# Patient Record
Sex: Female | Born: 1985
Health system: Southern US, Community
[De-identification: ages and names within clinical notes are randomized; demographics above are authoritative.]

## PROBLEM LIST (undated history)

## (undated) DIAGNOSIS — N979 Female infertility, unspecified: Secondary | ICD-10-CM

## (undated) DIAGNOSIS — R569 Unspecified convulsions: Secondary | ICD-10-CM

## (undated) DIAGNOSIS — R001 Bradycardia, unspecified: Secondary | ICD-10-CM

## (undated) HISTORY — DX: Female infertility, unspecified: N97.9

## (undated) HISTORY — PX: TONSILLECTOMY: SUR1361

## (undated) HISTORY — DX: Bradycardia, unspecified: R00.1

## (undated) HISTORY — PX: OTHER SURGICAL HISTORY: SHX169

## (undated) HISTORY — PX: HERNIA REPAIR: SHX51

---

## 2005-04-14 DIAGNOSIS — F419 Anxiety disorder, unspecified: Secondary | ICD-10-CM

## 2005-04-14 HISTORY — DX: Anxiety disorder, unspecified: F41.9

## 2007-07-29 ENCOUNTER — Encounter: Payer: Self-pay | Admitting: Internal Medicine

## 2008-04-25 ENCOUNTER — Ambulatory Visit: Payer: Self-pay | Admitting: Internal Medicine

## 2008-04-25 DIAGNOSIS — R059 Cough, unspecified: Secondary | ICD-10-CM | POA: Insufficient documentation

## 2008-04-25 DIAGNOSIS — R05 Cough: Secondary | ICD-10-CM

## 2008-04-25 DIAGNOSIS — J309 Allergic rhinitis, unspecified: Secondary | ICD-10-CM | POA: Insufficient documentation

## 2008-04-27 ENCOUNTER — Telehealth: Payer: Self-pay | Admitting: Internal Medicine

## 2013-03-14 ENCOUNTER — Other Ambulatory Visit: Payer: Self-pay | Admitting: Obstetrics and Gynecology

## 2013-03-14 DIAGNOSIS — N644 Mastodynia: Secondary | ICD-10-CM

## 2013-03-17 ENCOUNTER — Ambulatory Visit
Admission: RE | Admit: 2013-03-17 | Discharge: 2013-03-17 | Disposition: A | Discharge status: Home / Self Care | Payer: BC Managed Care – PPO | Source: Ambulatory Visit | Attending: Obstetrics and Gynecology | Admitting: Obstetrics and Gynecology

## 2013-03-17 DIAGNOSIS — N644 Mastodynia: Secondary | ICD-10-CM

## 2016-03-10 ENCOUNTER — Emergency Department (HOSPITAL_COMMUNITY)
Admission: EM | Admit: 2016-03-10 | Discharge: 2016-03-10 | Disposition: A | Discharge status: Home / Self Care | Payer: BLUE CROSS/BLUE SHIELD | Attending: Emergency Medicine | Admitting: Emergency Medicine

## 2016-03-10 ENCOUNTER — Encounter (HOSPITAL_COMMUNITY): Payer: Self-pay | Admitting: *Deleted

## 2016-03-10 DIAGNOSIS — R1032 Left lower quadrant pain: Secondary | ICD-10-CM | POA: Diagnosis not present

## 2016-03-10 DIAGNOSIS — R55 Syncope and collapse: Secondary | ICD-10-CM | POA: Insufficient documentation

## 2016-03-10 HISTORY — DX: Unspecified convulsions: R56.9

## 2016-03-10 LAB — I-STAT BETA HCG BLOOD, ED (MC, WL, AP ONLY)

## 2016-03-10 LAB — COMPREHENSIVE METABOLIC PANEL
ALBUMIN: 4.1 g/dL (ref 3.5–5.0)
ALK PHOS: 37 U/L — AB (ref 38–126)
ALT: 23 U/L (ref 14–54)
AST: 33 U/L (ref 15–41)
Anion gap: 8 (ref 5–15)
BILIRUBIN TOTAL: 0.6 mg/dL (ref 0.3–1.2)
BUN: 13 mg/dL (ref 6–20)
CALCIUM: 9.4 mg/dL (ref 8.9–10.3)
CO2: 25 mmol/L (ref 22–32)
CREATININE: 1.04 mg/dL — AB (ref 0.44–1.00)
Chloride: 107 mmol/L (ref 101–111)
GFR calc Af Amer: >60 mL/min (ref >60)
GFR calc non Af Amer: >60 mL/min (ref >60)
GLUCOSE: 110 mg/dL — AB (ref 65–99)
Potassium: 4.3 mmol/L (ref 3.5–5.1)
SODIUM: 140 mmol/L (ref 135–145)
TOTAL PROTEIN: 6.1 g/dL — AB (ref 6.5–8.1)

## 2016-03-10 LAB — URINALYSIS, ROUTINE W REFLEX MICROSCOPIC
BILIRUBIN URINE: NEGATIVE
Glucose, UA: NEGATIVE mg/dL
HGB URINE DIPSTICK: NEGATIVE
KETONES UR: NEGATIVE mg/dL
Leukocytes, UA: NEGATIVE
Nitrite: NEGATIVE
Protein, ur: NEGATIVE mg/dL
SPECIFIC GRAVITY, URINE: 1.014 (ref 1.005–1.030)
pH: 6 (ref 5.0–8.0)

## 2016-03-10 LAB — CBC
HCT: 39 % (ref 36.0–46.0)
HEMOGLOBIN: 12.7 g/dL (ref 12.0–15.0)
MCH: 29.8 pg (ref 26.0–34.0)
MCHC: 32.6 g/dL (ref 30.0–36.0)
MCV: 91.5 fL (ref 78.0–100.0)
PLATELETS: 236 10*3/uL (ref 150–400)
RBC: 4.26 MIL/uL (ref 3.87–5.11)
RDW: 13 % (ref 11.5–15.5)
WBC: 8.5 10*3/uL (ref 4.0–10.5)

## 2016-03-10 LAB — LIPASE, BLOOD: Lipase: 33 U/L (ref 11–51)

## 2016-03-10 MED ORDER — SODIUM CHLORIDE 0.9 % IV BOLUS (SEPSIS)
1000.0000 mL | Freq: Once | INTRAVENOUS | Status: AC
  Administered 2016-03-10: 1000 mL via INTRAVENOUS

## 2016-03-10 MED ORDER — KETOROLAC TROMETHAMINE 30 MG/ML IJ SOLN
30.0000 mg | Freq: Once | INTRAMUSCULAR | Status: AC
  Administered 2016-03-10: 30 mg via INTRAVENOUS
  Filled 2016-03-10: qty 1

## 2016-03-10 MED ORDER — METOCLOPRAMIDE HCL 5 MG/ML IJ SOLN
10.0000 mg | Freq: Once | INTRAMUSCULAR | Status: AC
  Administered 2016-03-10: 10 mg via INTRAVENOUS
  Filled 2016-03-10: qty 2

## 2016-03-10 MED ORDER — METOCLOPRAMIDE HCL 10 MG PO TABS: 10.0000 mg | ORAL_TABLET | Freq: Four times a day (QID) | ORAL | 0 refills | Status: DC | PRN

## 2016-03-10 NOTE — ED Triage Notes (Addendum)
PT states acute onset lower abdominal pain "like something popped", L greater than R, diarrhea and nausea.  2 episodes of "blacking out" for less than 1 min, while sitting on toilet.  Hx of tonic/clonic seizures but hasn't had 1 for 10 years.  Hx of syncope with pain.

## 2016-03-10 NOTE — ED Provider Notes (Signed)
[facility]-EMERGENCY DEPT Provider Note   CSN: [id number] Arrival date & time: 03/10/16  0746     History   Chief Complaint Chief Complaint  Patient presents with  . Abdominal Cramping  . Near Syncope    possible seizure    HPI Rachael Hoover is a 30 y.o. female.   Abdominal Pain   This is a new problem. The current episode started 1 to 2 hours ago. The problem occurs constantly. The problem has been gradually improving. The pain is located in the LLQ. The quality of the pain is pressure-like, sharp and cramping. The pain is moderate. Associated symptoms include vomiting. Pertinent negatives include anorexia, fever, dysuria and frequency. Nothing aggravates the symptoms. Relieved by: time.    Past Medical History:  Diagnosis Date  . Seizures [facility])     Patient Active Problem List   Diagnosis Date Noted  . RHINITIS 04/25/2008  . COUGH 04/25/2008    Past Surgical History:  Procedure Laterality Date  . HERNIA REPAIR     when a small child - inguinal ?  . TONSILLECTOMY      OB History    No data available       Home Medications    Prior to Admission medications   Medication Sig Start Date End Date Taking? Authorizing Provider  ibuprofen (ADVIL,MOTRIN) 200 MG tablet Take 600 mg by mouth every 6 (six) hours as needed for cramping.   Yes Historical Provider, MD  oxymetazoline (AFRIN) 0.05 % nasal spray Place 1 spray into both nostrils at bedtime as needed for congestion.   Yes Historical Provider, MD  metoCLOPramide (REGLAN) 10 MG tablet Take 1 tablet (10 mg total) by mouth every 6 (six) hours as needed for nausea (nausea/headache). 03/10/16   Marily Memos, MD    Family History No family history on file.  Social History Social History  Substance Use Topics  . Smoking status: Never Smoker  . Smokeless tobacco: Never Used  . Alcohol use Yes     Comment: few days a week     Allergies   Hydrocodone-acetaminophen   Review of Systems Review of Systems    Constitutional: Negative for fever.  Gastrointestinal: Positive for abdominal pain and vomiting. Negative for anorexia.  Genitourinary: Negative for dysuria and frequency.  All other systems reviewed and are negative.    Physical Exam Updated Vital Signs BP 114/85 (BP Location: Right Arm)   Pulse 69   Temp 97.6 F (36.4 C) (Oral)   Resp 16   Ht 5\' 8"  (1.727 m)   Wt 165 lb (74.8 kg)   LMP 03/06/2016   SpO2 100%   BMI 25.09 kg/m   Physical Exam  Constitutional: She is oriented to person, place, and time. She appears well-developed and well-nourished.  HENT:  Head: Normocephalic and atraumatic.  Eyes: Conjunctivae and EOM are normal.  Neck: Normal range of motion.  Cardiovascular: Normal rate and regular rhythm.   Pulmonary/Chest: Effort normal. No stridor. No respiratory distress.  Abdominal: Soft. Bowel sounds are normal. She exhibits no distension and no mass. There is no tenderness. There is no rebound.  Musculoskeletal: She exhibits no edema, tenderness or deformity.  Neurological: She is alert and oriented to person, place, and time. No cranial nerve deficit. Coordination normal.  No altered mental status, able to give full seemingly accurate history.  Face is symmetric, EOM's intact, pupils equal and reactive, vision intact, tongue and uvula midline without deviation Upper and Lower extremity motor 5/5, intact pain perception  in distal extremities, 2+ reflexes in biceps, patella and achilles tendons. Finger to nose normal, heel to shin normal. Walks without assistance or evident ataxia.   Skin: Skin is warm and dry.  Nursing note and vitals reviewed.    ED Treatments / Results  Labs (all labs ordered are listed, but only abnormal results are displayed) Labs Reviewed  COMPREHENSIVE METABOLIC PANEL - Abnormal; Notable for the following:       Result Value   Glucose, Bld 110 (*)    Creatinine, Ser 1.04 (*)    Total Protein 6.1 (*)    Alkaline Phosphatase 37 (*)     All other components within normal limits  LIPASE, BLOOD  CBC  URINALYSIS, ROUTINE W REFLEX MICROSCOPIC (NOT [facility])  I-STAT BETA HCG BLOOD, ED ([facility], [facility], [facility] ONLY)    EKG  EKG Interpretation  Date/Time:  Monday March 10 2016 09:50:02 EST Ventricular Rate:  71 PR Interval:    QRS Duration: 87 QT Interval:  402 QTC Calculation: 437 R Axis:   88 Text Interpretation:  Sinus rhythm Probable left atrial enlargement Confirmed by Kellyanne Ellwanger MD, Barbara CowerJASON ([id number]) on 03/10/2016 10:55:04 AM       Radiology No results found.  Procedures Procedures (including critical care time)  Medications Ordered in ED Medications  ketorolac (TORADOL) 30 MG/ML injection 30 mg (30 mg Intravenous Given 03/10/16 0949)  sodium chloride 0.9 % bolus 1,000 mL (1,000 mLs Intravenous New Bag/Given 03/10/16 0949)  metoCLOPramide (REGLAN) injection 10 mg (10 mg Intravenous Given 03/10/16 0949)     Initial Impression / Assessment and Plan / ED Course  I have reviewed the triage vital signs and the nursing notes.  Pertinent labs & imaging results that were available during my care of the patient were reviewed by me and considered in my medical decision making (see chart for details).  Clinical Course    Suspect ovarian cyst that has ruptured. Less likely kidney stone. Also with syncope and myoclonic jerking rather than seizures.   Urine clear, more likely ruptured ovarian cyst. Symptoms improved. Plan for dc on nausea meds with pcp follow up.   Final Clinical Impressions(s) / ED Diagnoses   Final diagnoses:  Left lower quadrant pain  Syncope, unspecified syncope type    New Prescriptions New Prescriptions   METOCLOPRAMIDE (REGLAN) 10 MG TABLET    Take 1 tablet (10 mg total) by mouth every 6 (six) hours as needed for nausea (nausea/headache).     Marily MemosJason Sayana Salley, MD 03/10/16 [phone]

## 2016-03-10 NOTE — ED Notes (Signed)
Pt is in stable condition upon d/c and ambulates from ED. 

## 2016-03-10 NOTE — ED Notes (Signed)
ED Provider at bedside. 

## 2018-05-13 ENCOUNTER — Ambulatory Visit (INDEPENDENT_AMBULATORY_CARE_PROVIDER_SITE_OTHER): Payer: BLUE CROSS/BLUE SHIELD | Admitting: Psychology

## 2018-05-13 DIAGNOSIS — F411 Generalized anxiety disorder: Secondary | ICD-10-CM | POA: Diagnosis not present

## 2018-05-25 ENCOUNTER — Ambulatory Visit (INDEPENDENT_AMBULATORY_CARE_PROVIDER_SITE_OTHER): Payer: BLUE CROSS/BLUE SHIELD | Admitting: Psychology

## 2018-05-25 DIAGNOSIS — F411 Generalized anxiety disorder: Secondary | ICD-10-CM

## 2018-06-08 ENCOUNTER — Ambulatory Visit: Payer: BLUE CROSS/BLUE SHIELD | Admitting: Psychology

## 2018-06-22 ENCOUNTER — Ambulatory Visit (INDEPENDENT_AMBULATORY_CARE_PROVIDER_SITE_OTHER): Payer: BLUE CROSS/BLUE SHIELD | Admitting: Psychology

## 2018-06-22 DIAGNOSIS — F411 Generalized anxiety disorder: Secondary | ICD-10-CM | POA: Diagnosis not present

## 2018-07-07 ENCOUNTER — Ambulatory Visit: Payer: BLUE CROSS/BLUE SHIELD | Admitting: Psychology

## 2018-07-28 ENCOUNTER — Ambulatory Visit (INDEPENDENT_AMBULATORY_CARE_PROVIDER_SITE_OTHER): Payer: BLUE CROSS/BLUE SHIELD | Admitting: Psychology

## 2018-07-28 DIAGNOSIS — F411 Generalized anxiety disorder: Secondary | ICD-10-CM

## 2018-08-24 ENCOUNTER — Ambulatory Visit: Payer: BLUE CROSS/BLUE SHIELD | Admitting: Psychology

## 2018-09-07 ENCOUNTER — Ambulatory Visit (INDEPENDENT_AMBULATORY_CARE_PROVIDER_SITE_OTHER): Payer: BLUE CROSS/BLUE SHIELD | Admitting: Psychology

## 2018-09-07 DIAGNOSIS — F411 Generalized anxiety disorder: Secondary | ICD-10-CM | POA: Diagnosis not present

## 2018-09-27 ENCOUNTER — Ambulatory Visit (INDEPENDENT_AMBULATORY_CARE_PROVIDER_SITE_OTHER): Payer: BC Managed Care – PPO | Admitting: Psychology

## 2018-09-27 DIAGNOSIS — F411 Generalized anxiety disorder: Secondary | ICD-10-CM | POA: Diagnosis not present

## 2018-10-19 ENCOUNTER — Ambulatory Visit (INDEPENDENT_AMBULATORY_CARE_PROVIDER_SITE_OTHER): Payer: 59 | Admitting: Psychology

## 2018-10-19 DIAGNOSIS — R69 Illness, unspecified: Secondary | ICD-10-CM | POA: Diagnosis not present

## 2018-10-19 DIAGNOSIS — F411 Generalized anxiety disorder: Secondary | ICD-10-CM | POA: Diagnosis not present

## 2018-11-01 ENCOUNTER — Ambulatory Visit (INDEPENDENT_AMBULATORY_CARE_PROVIDER_SITE_OTHER): Payer: 59 | Admitting: Psychology

## 2018-11-01 DIAGNOSIS — R69 Illness, unspecified: Secondary | ICD-10-CM | POA: Diagnosis not present

## 2018-11-01 DIAGNOSIS — F411 Generalized anxiety disorder: Secondary | ICD-10-CM | POA: Diagnosis not present

## 2018-11-15 ENCOUNTER — Ambulatory Visit (INDEPENDENT_AMBULATORY_CARE_PROVIDER_SITE_OTHER): Payer: 59 | Admitting: Psychology

## 2018-11-15 DIAGNOSIS — R69 Illness, unspecified: Secondary | ICD-10-CM | POA: Diagnosis not present

## 2018-11-15 DIAGNOSIS — F411 Generalized anxiety disorder: Secondary | ICD-10-CM

## 2018-11-29 ENCOUNTER — Ambulatory Visit (INDEPENDENT_AMBULATORY_CARE_PROVIDER_SITE_OTHER): Payer: 59 | Admitting: Psychology

## 2018-11-29 DIAGNOSIS — R69 Illness, unspecified: Secondary | ICD-10-CM | POA: Diagnosis not present

## 2018-11-29 DIAGNOSIS — F411 Generalized anxiety disorder: Secondary | ICD-10-CM | POA: Diagnosis not present

## 2018-12-07 DIAGNOSIS — Z130 Encounter for screening for diseases of the blood and blood-forming organs and certain disorders involving the immune mechanism: Secondary | ICD-10-CM | POA: Diagnosis not present

## 2018-12-07 DIAGNOSIS — Z01419 Encounter for gynecological examination (general) (routine) without abnormal findings: Secondary | ICD-10-CM | POA: Diagnosis not present

## 2018-12-07 DIAGNOSIS — N970 Female infertility associated with anovulation: Secondary | ICD-10-CM | POA: Insufficient documentation

## 2018-12-07 DIAGNOSIS — N946 Dysmenorrhea, unspecified: Secondary | ICD-10-CM | POA: Insufficient documentation

## 2018-12-07 DIAGNOSIS — Z1389 Encounter for screening for other disorder: Secondary | ICD-10-CM | POA: Diagnosis not present

## 2018-12-07 DIAGNOSIS — N926 Irregular menstruation, unspecified: Secondary | ICD-10-CM | POA: Diagnosis not present

## 2018-12-07 DIAGNOSIS — Z6828 Body mass index (BMI) 28.0-28.9, adult: Secondary | ICD-10-CM | POA: Diagnosis not present

## 2018-12-22 ENCOUNTER — Ambulatory Visit: Payer: 59 | Admitting: Psychology

## 2019-01-12 DIAGNOSIS — N926 Irregular menstruation, unspecified: Secondary | ICD-10-CM | POA: Diagnosis not present

## 2019-01-14 ENCOUNTER — Ambulatory Visit: Payer: Self-pay

## 2019-01-14 DIAGNOSIS — Z23 Encounter for immunization: Secondary | ICD-10-CM

## 2019-01-21 DIAGNOSIS — N979 Female infertility, unspecified: Secondary | ICD-10-CM | POA: Diagnosis not present

## 2019-01-24 ENCOUNTER — Ambulatory Visit (INDEPENDENT_AMBULATORY_CARE_PROVIDER_SITE_OTHER): Payer: 59 | Admitting: Psychology

## 2019-01-24 DIAGNOSIS — R69 Illness, unspecified: Secondary | ICD-10-CM | POA: Diagnosis not present

## 2019-01-24 DIAGNOSIS — F411 Generalized anxiety disorder: Secondary | ICD-10-CM | POA: Diagnosis not present

## 2019-02-11 ENCOUNTER — Ambulatory Visit (INDEPENDENT_AMBULATORY_CARE_PROVIDER_SITE_OTHER): Payer: 59 | Admitting: Psychology

## 2019-02-11 DIAGNOSIS — R69 Illness, unspecified: Secondary | ICD-10-CM | POA: Diagnosis not present

## 2019-02-11 DIAGNOSIS — F411 Generalized anxiety disorder: Secondary | ICD-10-CM

## 2019-02-22 DIAGNOSIS — M25512 Pain in left shoulder: Secondary | ICD-10-CM | POA: Diagnosis not present

## 2019-02-28 ENCOUNTER — Ambulatory Visit: Payer: 59 | Admitting: Psychology

## 2019-03-08 DIAGNOSIS — N9489 Other specified conditions associated with female genital organs and menstrual cycle: Secondary | ICD-10-CM | POA: Diagnosis not present

## 2019-03-08 DIAGNOSIS — N926 Irregular menstruation, unspecified: Secondary | ICD-10-CM | POA: Diagnosis not present

## 2019-03-22 ENCOUNTER — Ambulatory Visit: Payer: 59 | Admitting: Psychology

## 2019-03-22 ENCOUNTER — Ambulatory Visit (INDEPENDENT_AMBULATORY_CARE_PROVIDER_SITE_OTHER): Payer: 59 | Admitting: Psychology

## 2019-03-22 DIAGNOSIS — R69 Illness, unspecified: Secondary | ICD-10-CM | POA: Diagnosis not present

## 2019-03-22 DIAGNOSIS — F411 Generalized anxiety disorder: Secondary | ICD-10-CM

## 2019-04-01 DIAGNOSIS — N979 Female infertility, unspecified: Secondary | ICD-10-CM | POA: Diagnosis not present

## 2019-04-05 ENCOUNTER — Ambulatory Visit: Payer: Managed Care, Other (non HMO) | Attending: Internal Medicine

## 2019-04-05 DIAGNOSIS — Z20828 Contact with and (suspected) exposure to other viral communicable diseases: Secondary | ICD-10-CM | POA: Diagnosis not present

## 2019-04-05 DIAGNOSIS — Z20822 Contact with and (suspected) exposure to covid-19: Secondary | ICD-10-CM

## 2019-04-07 LAB — NOVEL CORONAVIRUS, NAA: SARS-CoV-2, NAA: DETECTED — AB

## 2019-04-11 ENCOUNTER — Ambulatory Visit (INDEPENDENT_AMBULATORY_CARE_PROVIDER_SITE_OTHER): Payer: Managed Care, Other (non HMO) | Admitting: Psychology

## 2019-04-11 DIAGNOSIS — F411 Generalized anxiety disorder: Secondary | ICD-10-CM | POA: Diagnosis not present

## 2019-04-11 DIAGNOSIS — R69 Illness, unspecified: Secondary | ICD-10-CM | POA: Diagnosis not present

## 2019-05-13 ENCOUNTER — Ambulatory Visit (INDEPENDENT_AMBULATORY_CARE_PROVIDER_SITE_OTHER): Payer: Managed Care, Other (non HMO) | Admitting: Psychology

## 2019-05-13 DIAGNOSIS — F411 Generalized anxiety disorder: Secondary | ICD-10-CM | POA: Diagnosis not present

## 2019-05-13 DIAGNOSIS — R69 Illness, unspecified: Secondary | ICD-10-CM | POA: Diagnosis not present

## 2019-06-10 DIAGNOSIS — R869 Unspecified abnormal finding in specimens from male genital organs: Secondary | ICD-10-CM | POA: Insufficient documentation

## 2019-06-10 DIAGNOSIS — N9489 Other specified conditions associated with female genital organs and menstrual cycle: Secondary | ICD-10-CM | POA: Diagnosis not present

## 2019-06-17 ENCOUNTER — Ambulatory Visit (INDEPENDENT_AMBULATORY_CARE_PROVIDER_SITE_OTHER): Payer: 59 | Admitting: Psychology

## 2019-06-17 DIAGNOSIS — F411 Generalized anxiety disorder: Secondary | ICD-10-CM

## 2019-06-17 DIAGNOSIS — R69 Illness, unspecified: Secondary | ICD-10-CM | POA: Diagnosis not present

## 2019-07-08 ENCOUNTER — Ambulatory Visit (INDEPENDENT_AMBULATORY_CARE_PROVIDER_SITE_OTHER): Payer: 59 | Admitting: Psychology

## 2019-07-08 DIAGNOSIS — R69 Illness, unspecified: Secondary | ICD-10-CM | POA: Diagnosis not present

## 2019-07-08 DIAGNOSIS — F411 Generalized anxiety disorder: Secondary | ICD-10-CM | POA: Diagnosis not present

## 2019-08-04 ENCOUNTER — Ambulatory Visit: Payer: 59 | Admitting: Primary Care

## 2019-08-15 ENCOUNTER — Ambulatory Visit (INDEPENDENT_AMBULATORY_CARE_PROVIDER_SITE_OTHER): Payer: 59 | Admitting: Psychology

## 2019-08-15 DIAGNOSIS — F411 Generalized anxiety disorder: Secondary | ICD-10-CM | POA: Diagnosis not present

## 2019-08-15 DIAGNOSIS — R69 Illness, unspecified: Secondary | ICD-10-CM | POA: Diagnosis not present

## 2019-08-19 ENCOUNTER — Ambulatory Visit (INDEPENDENT_AMBULATORY_CARE_PROVIDER_SITE_OTHER): Payer: 59 | Admitting: Family Medicine

## 2019-08-19 ENCOUNTER — Encounter: Payer: Self-pay | Admitting: Family Medicine

## 2019-08-19 ENCOUNTER — Other Ambulatory Visit: Payer: Self-pay

## 2019-08-19 VITALS — BP 118/76 | HR 100 | Temp 97.5°F | Ht 68.0 in | Wt 184.0 lb

## 2019-08-19 DIAGNOSIS — F419 Anxiety disorder, unspecified: Secondary | ICD-10-CM | POA: Diagnosis not present

## 2019-08-19 DIAGNOSIS — Z130 Encounter for screening for diseases of the blood and blood-forming organs and certain disorders involving the immune mechanism: Secondary | ICD-10-CM

## 2019-08-19 DIAGNOSIS — N644 Mastodynia: Secondary | ICD-10-CM | POA: Insufficient documentation

## 2019-08-19 DIAGNOSIS — E559 Vitamin D deficiency, unspecified: Secondary | ICD-10-CM | POA: Diagnosis not present

## 2019-08-19 DIAGNOSIS — E663 Overweight: Secondary | ICD-10-CM

## 2019-08-19 DIAGNOSIS — J310 Chronic rhinitis: Secondary | ICD-10-CM

## 2019-08-19 DIAGNOSIS — T485X5A Adverse effect of other anti-common-cold drugs, initial encounter: Secondary | ICD-10-CM | POA: Diagnosis not present

## 2019-08-19 DIAGNOSIS — R69 Illness, unspecified: Secondary | ICD-10-CM | POA: Diagnosis not present

## 2019-08-19 DIAGNOSIS — F410 Panic disorder [episodic paroxysmal anxiety] without agoraphobia: Secondary | ICD-10-CM

## 2019-08-19 LAB — TSH: TSH: 1.07 u[IU]/mL (ref 0.35–4.50)

## 2019-08-19 LAB — CBC WITH DIFFERENTIAL/PLATELET
Basophils Absolute: 0 10*3/uL (ref 0.0–0.1)
Basophils Relative: 0.4 % (ref 0.0–3.0)
Eosinophils Absolute: 0.1 10*3/uL (ref 0.0–0.7)
Eosinophils Relative: 1.5 % (ref 0.0–5.0)
HCT: 42.4 % (ref 36.0–46.0)
Hemoglobin: 14.3 g/dL (ref 12.0–15.0)
Lymphocytes Relative: 26.1 % (ref 12.0–46.0)
Lymphs Abs: 1.4 10*3/uL (ref 0.7–4.0)
MCHC: 33.8 g/dL (ref 30.0–36.0)
MCV: 89 fl (ref 78.0–100.0)
Monocytes Absolute: 0.5 10*3/uL (ref 0.1–1.0)
Monocytes Relative: 8.5 % (ref 3.0–12.0)
Neutro Abs: 3.4 10*3/uL (ref 1.4–7.7)
Neutrophils Relative %: 63.5 % (ref 43.0–77.0)
Platelets: 275 10*3/uL (ref 150.0–400.0)
RBC: 4.76 Mil/uL (ref 3.87–5.11)
RDW: 11.9 % (ref 11.5–15.5)
WBC: 5.3 10*3/uL (ref 4.0–10.5)

## 2019-08-19 LAB — LIPID PANEL
Cholesterol: 205 mg/dL — ABNORMAL HIGH (ref 0–200)
HDL: 81.2 mg/dL (ref >39.00)
LDL Cholesterol: 113 mg/dL — ABNORMAL HIGH (ref 0–99)
NonHDL: 123.97
Total CHOL/HDL Ratio: 3
Triglycerides: 56 mg/dL (ref 0.0–149.0)
VLDL: 11.2 mg/dL (ref 0.0–40.0)

## 2019-08-19 LAB — VITAMIN D 25 HYDROXY (VIT D DEFICIENCY, FRACTURES): VITD: 52.06 ng/mL (ref 30.00–100.00)

## 2019-08-19 MED ORDER — CLONAZEPAM 0.5 MG PO TABS: 0.5000 mg | ORAL_TABLET | Freq: Two times a day (BID) | ORAL | 0 refills | Status: DC | PRN

## 2019-08-19 MED ORDER — FLUTICASONE PROPIONATE 50 MCG/ACT NA SUSP: 2.0000 | Freq: Every day | NASAL | 6 refills | Status: DC

## 2019-08-19 MED ORDER — ALPRAZOLAM ER 0.5 MG PO TB24: 0.5000 mg | ORAL_TABLET | Freq: Every day | ORAL | 0 refills | Status: DC | PRN

## 2019-08-19 NOTE — Patient Instructions (Signed)
A resource that I like is www.dietdoctor.com/diabetes/diet  Here are some guidelines to help you with meal planning -  Avoid all processed and packaged foods (bread, pasta, crackers, chips, etc) and beverages containing calories.  Avoid added sugars and excessive natural sugars.  Attention to how you feel if you consume artificial sweeteners.  Do they make you more hungry or raise your blood sugar?  With every meal and snack, aim to get 20 g of protein (3 ounces of meat, 4 ounces of fish, 3 eggs, protein powder, 1 cup Greek yogurt, 1 cup cottage cheese, etc.)  Increase fiber in the form of non-starchy vegetables.  These help you feel full with very little carbohydrates and are good for gut health.  Eat 1 serving healthy carb per meal- 1/2 cup brown rice, beans, potato, corn- pay attention to whether or not this significantly raises your blood sugar. If it does, reduce the frequency you consume these.   Eat 2-3 servings of lower sugar fruits daily.  This includes berries, apples, oranges, peaches, pears, one half banana.  Have small amounts of good fats such as avocado, nuts, olive oil, nut butters, olives.  Add a little cheese to your salads to make them tasty.    

## 2019-08-19 NOTE — Progress Notes (Signed)
Subjective:    Patient ID: Rachael Hoover, female    DOB: 05-25-1985, 34 y.o.   MRN: [id number]  HPI This is a 34 yo female who presents today to establish care, prior patient Dr. Schuyler Amor. Works for the [employer] but will be going to work for [employer] in [employer]. Married to [place]. Enjoys riding horses. Has been trying to conceive for 4 years.   Last CPE- 11/2018 Pap- 06/03/2016, seeing gyn Dr. Ellyn Hack Tdap- unsure Covid- second vaccine 07/14/19 Flu- annual Eye- 2016, overdue Dental- regular Exercise- not regular Sleep- good, always tired Diet- terrible, junk  Anxiety disorder- for several years. Rarely uses clonopin/ alprazolam. Still has some in bottles, seeing Synetta Fail for therapy has been very helpful.  Left shoulder pain- chronic, has seen ortho, given exercises.  Intermittent in nature.  Not currently bothering her.  Low vit d- diagnosed 8/20, has been on high dose supplement  Allergic rhinitis-has been using Afrin style nasal spray several times a day for a while now.  Has trouble with congestion especially at nighttime if she is not using.  Has not tried anything else.  Review of Systems Per HPI    Objective:   Physical Exam Vitals reviewed.  Constitutional:      General: She is not in acute distress.    Appearance: Normal appearance. She is normal weight. She is not ill-appearing, toxic-appearing or diaphoretic.  HENT:     Head: Normocephalic and atraumatic.     Right Ear: External ear normal.     Left Ear: External ear normal.     Nose: Nose normal.     Mouth/Throat:     Mouth: Mucous membranes are moist.     Pharynx: Oropharynx is clear.  Eyes:     Conjunctiva/sclera: Conjunctivae normal.  Cardiovascular:     Rate and Rhythm: Normal rate and regular rhythm.     Heart sounds: Normal heart sounds.  Pulmonary:     Effort: Pulmonary effort is normal.     Breath sounds: Normal breath sounds.  Musculoskeletal:     Cervical back: Normal range of  motion and neck supple.  Skin:    General: Skin is warm and dry.  Neurological:     Mental Status: She is alert and oriented to person, place, and time.  Psychiatric:        Mood and Affect: Mood normal.        Behavior: Behavior normal.        Thought Content: Thought content normal.        Judgment: Judgment normal.       BP 118/76   Pulse 100   Temp (!) 97.5 F (36.4 C) (Tympanic)   Ht 5\' 8"  (1.727 m)   Wt 184 lb (83.5 kg)   SpO2 97%   BMI 27.98 kg/m  Wt Readings from Last 3 Encounters:  08/19/19 184 lb (83.5 kg)  03/10/16 165 lb (74.8 kg)       Depression screen PHQ 2/9 08/19/2019  Decreased Interest 0  Down, Depressed, Hopeless 0  PHQ - 2 Score 0    Assessment & Plan:  1. Nasal congestion due to prolonged use of decongestants -She will start with fluticasone nasal spray twice a day for 3 days and then go to once a day.  During this time she will decrease amount of Afrin that she is using until she is not using any. - fluticasone (FLONASE) 50 MCG/ACT nasal spray; Place 2 sprays into both nostrils  daily.  Dispense: 16 g; Refill: 6  2. Panic attack -Improved with therapy and rare use of benzodiazepines.  Encouraged her to continue therapy especially during job transition and provided small amount of benzodiazepines for as needed use. - ALPRAZolam (XANAX XR) 0.5 MG 24 hr tablet; Take 1 tablet (0.5 mg total) by mouth daily as needed for anxiety (panic attack).  Dispense: 10 tablet; Refill: 0  3. Anxiety - clonazePAM (KLONOPIN) 0.5 MG tablet; Take 1 tablet (0.5 mg total) by mouth 2 (two) times daily as needed for anxiety.  Dispense: 20 tablet; Refill: 0  4. Vitamin D deficiency - Vitamin D, 25-hydroxy  5. Overweight (BMI 25.0-29.9) -Provided information about healthy food choices and meal planning and encouraged patient to work on 1 meal per week - TSH - Lipid Panel  6. Screening for deficiency anemia - CBC with Differential  -Follow-up in follow-up for  CPE  This visit occurred during the SARS-CoV-2 public health emergency.  Safety protocols were in place, including screening questions prior to the visit, additional usage of staff PPE, and extensive cleaning of exam room while observing appropriate contact time as indicated for disinfecting solutions.    Clarene Reamer, FNP-BC  [facility]  08/19/2019 5:33 PM

## 2019-09-01 ENCOUNTER — Ambulatory Visit (INDEPENDENT_AMBULATORY_CARE_PROVIDER_SITE_OTHER): Payer: 59 | Admitting: Psychology

## 2019-09-01 DIAGNOSIS — F411 Generalized anxiety disorder: Secondary | ICD-10-CM | POA: Diagnosis not present

## 2019-09-01 DIAGNOSIS — R69 Illness, unspecified: Secondary | ICD-10-CM | POA: Diagnosis not present

## 2019-09-19 ENCOUNTER — Ambulatory Visit (INDEPENDENT_AMBULATORY_CARE_PROVIDER_SITE_OTHER): Payer: 59 | Admitting: Psychology

## 2019-09-19 DIAGNOSIS — R69 Illness, unspecified: Secondary | ICD-10-CM | POA: Diagnosis not present

## 2019-09-19 DIAGNOSIS — F411 Generalized anxiety disorder: Secondary | ICD-10-CM

## 2020-01-20 ENCOUNTER — Ambulatory Visit (INDEPENDENT_AMBULATORY_CARE_PROVIDER_SITE_OTHER): Payer: BC Managed Care – PPO | Admitting: Psychology

## 2020-01-20 DIAGNOSIS — F411 Generalized anxiety disorder: Secondary | ICD-10-CM | POA: Diagnosis not present

## 2020-01-27 LAB — RESULTS CONSOLE HPV: CHL HPV: NEGATIVE

## 2020-01-27 LAB — HM PAP SMEAR: HM Pap smear: NORMAL

## 2020-02-13 ENCOUNTER — Ambulatory Visit (INDEPENDENT_AMBULATORY_CARE_PROVIDER_SITE_OTHER): Payer: BC Managed Care – PPO | Admitting: Psychology

## 2020-02-13 DIAGNOSIS — F411 Generalized anxiety disorder: Secondary | ICD-10-CM | POA: Diagnosis not present

## 2020-05-04 ENCOUNTER — Ambulatory Visit (INDEPENDENT_AMBULATORY_CARE_PROVIDER_SITE_OTHER): Payer: BC Managed Care – PPO | Admitting: Psychology

## 2020-05-04 DIAGNOSIS — F411 Generalized anxiety disorder: Secondary | ICD-10-CM | POA: Diagnosis not present

## 2020-06-01 ENCOUNTER — Ambulatory Visit: Payer: BC Managed Care – PPO | Admitting: Psychology

## 2020-07-06 ENCOUNTER — Ambulatory Visit (INDEPENDENT_AMBULATORY_CARE_PROVIDER_SITE_OTHER): Payer: BC Managed Care – PPO | Admitting: Psychology

## 2020-07-06 DIAGNOSIS — F411 Generalized anxiety disorder: Secondary | ICD-10-CM

## 2021-01-12 ENCOUNTER — Other Ambulatory Visit: Payer: Self-pay

## 2021-01-12 ENCOUNTER — Encounter: Payer: Self-pay | Admitting: Emergency Medicine

## 2021-01-12 ENCOUNTER — Ambulatory Visit
Admission: EM | Admit: 2021-01-12 | Discharge: 2021-01-12 | Disposition: A | Discharge status: Home / Self Care | Payer: BC Managed Care – PPO | Attending: Emergency Medicine | Admitting: Emergency Medicine

## 2021-01-12 DIAGNOSIS — J329 Chronic sinusitis, unspecified: Secondary | ICD-10-CM

## 2021-01-12 DIAGNOSIS — B9689 Other specified bacterial agents as the cause of diseases classified elsewhere: Secondary | ICD-10-CM | POA: Diagnosis not present

## 2021-01-12 MED ORDER — AMOXICILLIN 875 MG PO TABS: 875.0000 mg | ORAL_TABLET | Freq: Two times a day (BID) | ORAL | 0 refills | Status: AC

## 2021-01-12 NOTE — ED Provider Notes (Signed)
CHIEF COMPLAINT:   Chief Complaint  Patient presents with   URI     SUBJECTIVE/HPI:  HPI A very pleasant 35 y.o.Female presents today with sinus pressure, congestion, sore throat and cough that started about 8 days ago.. Patient does not report any shortness of breath, chest pain, palpitations, visual changes, weakness, tingling, headache, nausea, vomiting, diarrhea, fever, chills.  Patient is currently [redacted] weeks pregnant.   has a past medical history of Anxiety (2007) and Seizures (HCC).  ROS:  Review of Systems See Subjective/HPI Medications, Allergies and Problem List personally reviewed in Epic today OBJECTIVE:   Vitals:   01/12/21 1502  BP: (!) 131/95  Pulse: 90  Resp: 18  Temp: 99.6 F (37.6 C)  SpO2: 98%    Physical Exam   General: Appears well-developed and well-nourished. No acute distress.  HEENT Head: Normocephalic and atraumatic.  + frontal and nasal bridge tenderness noted to palpation. Ears: Hearing grossly intact, no drainage or visible deformity.  Nose: No nasal deviation. Mouth/Throat: No stridor or tracheal deviation.  Non erythematous posterior pharynx noted with clear drainage present.  No white patchy exudate noted. Eyes: Conjunctivae and EOM are normal. No eye drainage or scleral icterus bilaterally.  Neck: Normal range of motion, neck is supple. Cardiovascular: Normal rate. Regular rhythm; no murmurs, gallops, or rubs.  Pulm/Chest: No respiratory distress. Breath sounds normal bilaterally without wheezes, rhonchi, or rales.  Neurological: Alert and oriented to person, place, and time.  Skin: Skin is warm and dry.  No rashes, lesions, abrasions or bruising noted to skin.   Psychiatric: Normal mood, affect, behavior, and thought content.   Vital signs and nursing note reviewed.   Patient stable and cooperative with examination. PROCEDURES:    LABS/X-RAYS/EKG/MEDS:   No results found for any visits on 01/12/21.  MEDICAL DECISION MAKING:    Patient presents with sinus pressure, congestion, sore throat and cough that started about 8 days ago.. Patient does not report any shortness of breath, chest pain, palpitations, visual changes, weakness, tingling, headache, nausea, vomiting, diarrhea, fever, chills. Patient is currently [redacted] weeks pregnant.  Given symptoms along with assessment findings, concern for frontal bacterial sinusitis.  Rx'd amoxicillin to the patient's preferred pharmacy and advised about home treatment and care to include rest, Mucinex, Robitussin, Flonase.  Advised to return for any new or worse swelling or redness in her face, around her eyes or new high fever.  Also advised that she may use Tylenol as needed and the use of a humidifier may also help her symptoms.  Patient verbalized understanding and agreed with treatment plan.  Patient stable upon discharge. ASSESSMENT/PLAN:  1. Bacterial sinusitis - amoxicillin (AMOXIL) 875 MG tablet; Take 1 tablet (875 mg total) by mouth 2 (two) times daily for 7 days.  Dispense: 14 tablet; Refill: 0 Instructions about new medications and side effects provided.  Plan:   Discharge Instructions      Take amoxicillin as prescribed.  You may also use Mucinex or Robitussin as needed for coughing.  Sinusitis is an infection of the lining of the sinus cavities in your head. Sinusitis often follows a cold. It causes pain and pressure in your head and face. Take antibiotics as directed. Do not stop taking them just because you feel better. You need to take the full course of antibiotics. Rest, push lots of fluids (especially water), and utilize supportive care for symptoms. Breathe warm, moist area from a steamy shower, hot bath, or sink filled with hot water.  Avoid cold,  dry air.  Using a humidifier in your home may help.  Follow the directions for cleaning the machine. Put a hot, wet towel or a warm gel pack on your face 3-4 times a day for 5-10 minutes each time. You may take  acetaminophen (Tylenol) every 4-6 hours for muscle pain, joint pain, headaches. Flonase nasal spray can help alleviate congestion and sinus pressure. Saline nasal sprays or rinses can also help nasal congestion (use bottled or sterile water). Warm tea with lemon and honey can sooth sore throat and cough, as can cough drops.   Return to clinic for new or worse swelling or redness in your face or around your eyes, or if you have a new or higher fever.          Amalia Greenhouse, FNP 01/12/21 1521

## 2021-01-12 NOTE — ED Triage Notes (Addendum)
Symptoms for 8 days.  Sinus pressure, congestion, sore throat, cough.  Denies fever  Patient is pregnant and is an IVF patient

## 2021-01-12 NOTE — Discharge Instructions (Signed)
Take amoxicillin as prescribed.  You may also use Mucinex or Robitussin as needed for coughing.  Sinusitis is an infection of the lining of the sinus cavities in your head. Sinusitis often follows a cold. It causes pain and pressure in your head and face. Take antibiotics as directed. Do not stop taking them just because you feel better. You need to take the full course of antibiotics. Rest, push lots of fluids (especially water), and utilize supportive care for symptoms. Breathe warm, moist area from a steamy shower, hot bath, or sink filled with hot water.  Avoid cold, dry air.  Using a humidifier in your home may help.  Follow the directions for cleaning the machine. Put a hot, wet towel or a warm gel pack on your face 3-4 times a day for 5-10 minutes each time. You may take acetaminophen (Tylenol) every 4-6 hours for muscle pain, joint pain, headaches. Flonase nasal spray can help alleviate congestion and sinus pressure. Saline nasal sprays or rinses can also help nasal congestion (use bottled or sterile water). Warm tea with lemon and honey can sooth sore throat and cough, as can cough drops.   Return to clinic for new or worse swelling or redness in your face or around your eyes, or if you have a new or higher fever.

## 2021-02-11 LAB — HEPATITIS C ANTIBODY: HCV Ab: NEGATIVE

## 2021-02-11 LAB — OB RESULTS CONSOLE ABO/RH

## 2021-02-11 LAB — OB RESULTS CONSOLE RPR: RPR: NONREACTIVE

## 2021-02-11 LAB — OB RESULTS CONSOLE ANTIBODY SCREEN: Antibody Screen: NEGATIVE

## 2021-02-11 LAB — OB RESULTS CONSOLE GC/CHLAMYDIA: Gonorrhea: NEGATIVE

## 2021-02-11 LAB — OB RESULTS CONSOLE RUBELLA ANTIBODY, IGM: Rubella: IMMUNE

## 2021-04-14 DIAGNOSIS — I10 Essential (primary) hypertension: Secondary | ICD-10-CM

## 2021-04-14 HISTORY — DX: Essential (primary) hypertension: I10

## 2021-04-14 NOTE — L&D Delivery Note (Signed)
Delivery Note At 5:58 PM a viable and healthy female was delivered via Vaginal, Spontaneous (Presentation: Right Occiput Anterior).  APGAR: 8, 9; weight pending .   Placenta status: Spontaneous, Intact.  Cord: 3 vessels with loose nuchal cord x 1  The patient pushed for approximately 2 and half hours and delivered a vigorous female infant in the vertex ROA presentation with Apgar scores of 8 at 1 minute and 9 at 5 minutes.  Following delivery the infant was passed to the maternal abdomen.  Following a 1 minute delay, the cord was clamped and cut.  Placenta delivered spontaneously, intact, with three-vessel cord.  A small first-degree vaginal laceration was repaired with 3-0 Vicryl Rapide.  All sponge, instrument, needle counts were correct. Mother and baby are doing well following delivery.  .   Anesthesia: Epidural Episiotomy: None Lacerations: Vaginal;1st degree Suture Repair: 3.0 vicryl rapide Est. Blood Loss (mL): 237  Mom to postpartum.  Baby to Couplet care / Skin to Skin.  Waynard Reeds 08/30/2021, 6:29 PM

## 2021-04-15 DIAGNOSIS — O09519 Supervision of elderly primigravida, unspecified trimester: Secondary | ICD-10-CM | POA: Diagnosis not present

## 2021-04-15 DIAGNOSIS — R69 Illness, unspecified: Secondary | ICD-10-CM | POA: Diagnosis not present

## 2021-04-15 DIAGNOSIS — O4402 Placenta previa specified as without hemorrhage, second trimester: Secondary | ICD-10-CM | POA: Diagnosis not present

## 2021-04-15 DIAGNOSIS — O09812 Supervision of pregnancy resulting from assisted reproductive technology, second trimester: Secondary | ICD-10-CM | POA: Diagnosis not present

## 2021-04-15 DIAGNOSIS — O09512 Supervision of elderly primigravida, second trimester: Secondary | ICD-10-CM | POA: Diagnosis not present

## 2021-04-15 DIAGNOSIS — Z363 Encounter for antenatal screening for malformations: Secondary | ICD-10-CM | POA: Diagnosis not present

## 2021-04-15 DIAGNOSIS — Z3A19 19 weeks gestation of pregnancy: Secondary | ICD-10-CM | POA: Diagnosis not present

## 2021-04-24 DIAGNOSIS — J0120 Acute ethmoidal sinusitis, unspecified: Secondary | ICD-10-CM | POA: Diagnosis not present

## 2021-04-24 DIAGNOSIS — J310 Chronic rhinitis: Secondary | ICD-10-CM | POA: Diagnosis not present

## 2021-06-18 DIAGNOSIS — Z23 Encounter for immunization: Secondary | ICD-10-CM | POA: Diagnosis not present

## 2021-06-18 DIAGNOSIS — Z3689 Encounter for other specified antenatal screening: Secondary | ICD-10-CM | POA: Diagnosis not present

## 2021-06-18 DIAGNOSIS — O09513 Supervision of elderly primigravida, third trimester: Secondary | ICD-10-CM | POA: Diagnosis not present

## 2021-06-18 DIAGNOSIS — O09813 Supervision of pregnancy resulting from assisted reproductive technology, third trimester: Secondary | ICD-10-CM | POA: Diagnosis not present

## 2021-06-18 DIAGNOSIS — Z6791 Unspecified blood type, Rh negative: Secondary | ICD-10-CM | POA: Diagnosis not present

## 2021-06-18 DIAGNOSIS — Z3A28 28 weeks gestation of pregnancy: Secondary | ICD-10-CM | POA: Diagnosis not present

## 2021-07-18 DIAGNOSIS — O4443 Low lying placenta NOS or without hemorrhage, third trimester: Secondary | ICD-10-CM | POA: Diagnosis not present

## 2021-07-18 DIAGNOSIS — O09513 Supervision of elderly primigravida, third trimester: Secondary | ICD-10-CM | POA: Diagnosis not present

## 2021-07-18 DIAGNOSIS — O4403 Placenta previa specified as without hemorrhage, third trimester: Secondary | ICD-10-CM | POA: Diagnosis not present

## 2021-07-18 DIAGNOSIS — O09813 Supervision of pregnancy resulting from assisted reproductive technology, third trimester: Secondary | ICD-10-CM | POA: Diagnosis not present

## 2021-07-18 DIAGNOSIS — R69 Illness, unspecified: Secondary | ICD-10-CM | POA: Diagnosis not present

## 2021-07-18 DIAGNOSIS — Z3A32 32 weeks gestation of pregnancy: Secondary | ICD-10-CM | POA: Diagnosis not present

## 2021-08-13 DIAGNOSIS — O4443 Low lying placenta NOS or without hemorrhage, third trimester: Secondary | ICD-10-CM | POA: Diagnosis not present

## 2021-08-13 DIAGNOSIS — Z3A36 36 weeks gestation of pregnancy: Secondary | ICD-10-CM | POA: Diagnosis not present

## 2021-08-13 DIAGNOSIS — O09519 Supervision of elderly primigravida, unspecified trimester: Secondary | ICD-10-CM | POA: Diagnosis not present

## 2021-08-13 DIAGNOSIS — Z3685 Encounter for antenatal screening for Streptococcus B: Secondary | ICD-10-CM | POA: Diagnosis not present

## 2021-08-13 LAB — OB RESULTS CONSOLE GBS: GBS: NEGATIVE

## 2021-08-14 ENCOUNTER — Encounter: Payer: Self-pay | Admitting: *Deleted

## 2021-08-15 ENCOUNTER — Other Ambulatory Visit: Payer: Self-pay | Admitting: Obstetrics and Gynecology

## 2021-08-15 DIAGNOSIS — Z363 Encounter for antenatal screening for malformations: Secondary | ICD-10-CM

## 2021-08-19 ENCOUNTER — Encounter: Payer: Self-pay | Admitting: *Deleted

## 2021-08-19 ENCOUNTER — Other Ambulatory Visit: Payer: Self-pay | Admitting: Obstetrics and Gynecology

## 2021-08-19 ENCOUNTER — Ambulatory Visit: Payer: 59 | Admitting: *Deleted

## 2021-08-19 ENCOUNTER — Ambulatory Visit: Payer: 59 | Attending: Obstetrics and Gynecology

## 2021-08-19 VITALS — BP 133/91 | HR 87

## 2021-08-19 DIAGNOSIS — Z363 Encounter for antenatal screening for malformations: Secondary | ICD-10-CM | POA: Insufficient documentation

## 2021-08-19 DIAGNOSIS — O09513 Supervision of elderly primigravida, third trimester: Secondary | ICD-10-CM | POA: Insufficient documentation

## 2021-08-28 ENCOUNTER — Telehealth (HOSPITAL_COMMUNITY): Payer: Self-pay | Admitting: *Deleted

## 2021-08-28 ENCOUNTER — Other Ambulatory Visit: Payer: Self-pay | Admitting: Obstetrics and Gynecology

## 2021-08-28 ENCOUNTER — Encounter (HOSPITAL_COMMUNITY): Payer: Self-pay | Admitting: *Deleted

## 2021-08-28 DIAGNOSIS — O133 Gestational [pregnancy-induced] hypertension without significant proteinuria, third trimester: Secondary | ICD-10-CM

## 2021-08-28 DIAGNOSIS — Z3A38 38 weeks gestation of pregnancy: Secondary | ICD-10-CM | POA: Diagnosis not present

## 2021-08-28 DIAGNOSIS — O139 Gestational [pregnancy-induced] hypertension without significant proteinuria, unspecified trimester: Secondary | ICD-10-CM | POA: Diagnosis not present

## 2021-08-28 NOTE — H&P (Signed)
Rachael Hoover is a 36 y.o. female G1P0 at 38 5/7 weeks (EDD   09/07/21 by 6 week US and known DOC from IVF) presenting for IOL for gestational hypertension.  Prenatal care significant for:  1) Anxiety disorder  took meds prior to pregnancy (xanax and klonopin)--off now and doing ok   2) Primary female infertility  Stage 4 endometriosis--clinical diagnosis by US with endometriomas Lupron then embryo transfer   3) Placenta previa  Posterior previa on anatomy scan  RESOLVED MFM US 08/19/21 EFW 53%ile 6#13oz, vertex, LLP resolved   4) RhD negative   5) Advanced maternal age gravida  Embryo tested 46XX In vitro fertilization--elective single FET of PGT tested embryo on 12/20/2020  46XX  6) Possible borderline CHTN  baseline BP 130-140-80-90      Baby ASA q day         OB History     Gravida  1   Para      Term      Preterm      AB      Living         SAB      IAB      Ectopic      Multiple      Live Births             Past Medical History:  Diagnosis Date   Anxiety 2007   Infertility, female    Newborn product of in vitro fertilization (IVF) pregnancy    Past Surgical History:  Procedure Laterality Date   HERNIA REPAIR     when a small child - inguinal ?   ivf     TONSILLECTOMY     Family History: family history includes Anxiety disorder in her mother; Hypertension in her father. Social History:  reports that she has never smoked. She has never used smokeless tobacco. She reports that she does not currently use alcohol. She reports that she does not use drugs.     Maternal Diabetes: No Genetic Screening: Normal Maternal Ultrasounds/Referrals: Normal Fetal Ultrasounds or other Referrals:  None Maternal Substance Abuse:  No Significant Maternal Medications:  None Significant Maternal Lab Results:  Group B Strep negative Other Comments:  None  Review of Systems  Constitutional:  Negative for fever.  Gastrointestinal:  Negative for abdominal  pain.  Genitourinary:  Negative for vaginal bleeding.  Neurological:  Negative for headaches.  Maternal Medical History:  Contractions: Frequency: irregular.   Perceived severity is mild.   Fetal activity: Perceived fetal activity is normal.   Prenatal complications: IVF, gestational HTN, AMA Prenatal Complications - Diabetes: none.    Last menstrual period 01/10/2021. Exam Physical Exam  Prenatal labs: ABO, Rh: O/Negative/-- (10/31 0000) Antibody: Negative (10/31 0000) Rubella: Immune (10/31 0000) RPR: Nonreactive (10/31 0000)  HBsAg: Negative (10/31 0000)  HIV: Non-reactive (10/31 0000)  GBS:   Negative One hour GCT 118 Hgb AA Carrier screen negative x 3  Assessment/Plan: Pt for ripening and IOL at term for gestational hypertension. Labs on arrival.  Rx BP as needed.    Jeanae Whitmill W Ihan Pat 08/28/2021, 10:41 PM     

## 2021-08-28 NOTE — Telephone Encounter (Signed)
Preadmission screen  

## 2021-08-28 NOTE — H&P (View-Only) (Signed)
Rachael Hoover is a 36 y.o. female G1P0 at 69 5/7 weeks (EDD   09/07/21 by 6 week [place] and known DOC from IVF) presenting for IOL for gestational hypertension.  Prenatal care significant for:  1) Anxiety disorder  took meds prior to pregnancy (xanax and klonopin)--off now and doing ok   2) Primary female infertility  Stage 4 endometriosis--clinical diagnosis by [place] with endometriomas Lupron then embryo transfer   3) Placenta previa  Posterior previa on anatomy scan  RESOLVED MFM [place] 08/19/21 EFW 53%ile 6#13oz, vertex, LLP resolved   4) RhD negative   5) Advanced maternal age gravida  Embryo tested 46XX In vitro fertilization--elective single FET of PGT tested embryo on 12/20/2020  46XX  6) Possible borderline CHTN  baseline BP 130-140-80-90      Baby ASA q day         OB History     Gravida  1   Para      Term      Preterm      AB      Living         SAB      IAB      Ectopic      Multiple      Live Births             Past Medical History:  Diagnosis Date   Anxiety 2007   Infertility, female    Newborn product of in vitro fertilization (IVF) pregnancy    Past Surgical History:  Procedure Laterality Date   HERNIA REPAIR     when a small child - inguinal ?   ivf     TONSILLECTOMY     Family History: family history includes Anxiety disorder in her mother; Hypertension in her father. Social History:  reports that she has never smoked. She has never used smokeless tobacco. She reports that she does not currently use alcohol. She reports that she does not use drugs.     Maternal Diabetes: No Genetic Screening: Normal Maternal Ultrasounds/Referrals: Normal Fetal Ultrasounds or other Referrals:  None Maternal Substance Abuse:  No Significant Maternal Medications:  None Significant Maternal Lab Results:  Group B Strep negative Other Comments:  None  Review of Systems  Constitutional:  Negative for fever.  Gastrointestinal:  Negative for abdominal  pain.  Genitourinary:  Negative for vaginal bleeding.  Neurological:  Negative for headaches.  Maternal Medical History:  Contractions: Frequency: irregular.   Perceived severity is mild.   Fetal activity: Perceived fetal activity is normal.   Prenatal complications: IVF, gestational HTN, AMA Prenatal Complications - Diabetes: none.    Last menstrual period 01/10/2021. Exam Physical Exam  Prenatal labs: ABO, Rh: O/Negative/-- (10/31 0000) Antibody: Negative (10/31 0000) Rubella: Immune (10/31 0000) RPR: Nonreactive (10/31 0000)  HBsAg: Negative (10/31 0000)  HIV: Non-reactive (10/31 0000)  GBS:   Negative One hour GCT 118 Hgb AA Carrier screen negative x 3  Assessment/Plan: Pt for ripening and IOL at term for gestational hypertension. Labs on arrival.  Rx BP as needed.    Logan Bores 08/28/2021, 10:41 PM

## 2021-08-29 ENCOUNTER — Inpatient Hospital Stay (HOSPITAL_COMMUNITY): Payer: 59

## 2021-08-29 ENCOUNTER — Other Ambulatory Visit: Payer: Self-pay

## 2021-08-29 ENCOUNTER — Encounter (HOSPITAL_COMMUNITY): Payer: Self-pay | Admitting: Obstetrics and Gynecology

## 2021-08-29 ENCOUNTER — Inpatient Hospital Stay (HOSPITAL_COMMUNITY)
Admission: AD | Admit: 2021-08-29 | Discharge: 2021-09-01 | DRG: 807 | Disposition: A | Discharge status: Home / Self Care | Payer: 59 | Attending: Obstetrics and Gynecology | Admitting: Obstetrics and Gynecology

## 2021-08-29 DIAGNOSIS — F419 Anxiety disorder, unspecified: Secondary | ICD-10-CM | POA: Diagnosis not present

## 2021-08-29 DIAGNOSIS — O99892 Other specified diseases and conditions complicating childbirth: Secondary | ICD-10-CM | POA: Diagnosis not present

## 2021-08-29 DIAGNOSIS — Z3A38 38 weeks gestation of pregnancy: Secondary | ICD-10-CM

## 2021-08-29 DIAGNOSIS — O26893 Other specified pregnancy related conditions, third trimester: Secondary | ICD-10-CM | POA: Diagnosis not present

## 2021-08-29 DIAGNOSIS — O133 Gestational [pregnancy-induced] hypertension without significant proteinuria, third trimester: Principal | ICD-10-CM | POA: Diagnosis present

## 2021-08-29 DIAGNOSIS — N809 Endometriosis, unspecified: Secondary | ICD-10-CM | POA: Diagnosis not present

## 2021-08-29 DIAGNOSIS — Z6791 Unspecified blood type, Rh negative: Secondary | ICD-10-CM

## 2021-08-29 DIAGNOSIS — O134 Gestational [pregnancy-induced] hypertension without significant proteinuria, complicating childbirth: Secondary | ICD-10-CM | POA: Diagnosis not present

## 2021-08-29 DIAGNOSIS — R69 Illness, unspecified: Secondary | ICD-10-CM | POA: Diagnosis not present

## 2021-08-29 LAB — COMPREHENSIVE METABOLIC PANEL
ALT: 17 U/L (ref 0–44)
AST: 23 U/L (ref 15–41)
Albumin: 3.1 g/dL — ABNORMAL LOW (ref 3.5–5.0)
Alkaline Phosphatase: 140 U/L — ABNORMAL HIGH (ref 38–126)
Anion gap: 9 (ref 5–15)
BUN: 11 mg/dL (ref 6–20)
CO2: 21 mmol/L — ABNORMAL LOW (ref 22–32)
Calcium: 8.9 mg/dL (ref 8.9–10.3)
Chloride: 109 mmol/L (ref 98–111)
Creatinine, Ser: 0.95 mg/dL (ref 0.44–1.00)
GFR, Estimated: >60 mL/min (ref >60)
Glucose, Bld: 85 mg/dL (ref 70–99)
Potassium: 3.5 mmol/L (ref 3.5–5.1)
Sodium: 139 mmol/L (ref 135–145)
Total Bilirubin: 0.6 mg/dL (ref 0.3–1.2)
Total Protein: 6.3 g/dL — ABNORMAL LOW (ref 6.5–8.1)

## 2021-08-29 LAB — CBC
HCT: 40 % (ref 36.0–46.0)
Hemoglobin: 13 g/dL (ref 12.0–15.0)
MCH: 29.4 pg (ref 26.0–34.0)
MCHC: 32.5 g/dL (ref 30.0–36.0)
MCV: 90.5 fL (ref 80.0–100.0)
Platelets: 282 10*3/uL (ref 150–400)
RBC: 4.42 MIL/uL (ref 3.87–5.11)
RDW: 12.1 % (ref 11.5–15.5)
WBC: 10.1 10*3/uL (ref 4.0–10.5)
nRBC: 0 % (ref 0.0–0.2)

## 2021-08-29 LAB — TYPE AND SCREEN
ABO/RH(D): O NEG
Antibody Screen: POSITIVE

## 2021-08-29 LAB — PROTEIN / CREATININE RATIO, URINE
Creatinine, Urine: 17.99 mg/dL
Total Protein, Urine: <6 mg/dL

## 2021-08-29 LAB — RPR: RPR Ser Ql: NONREACTIVE

## 2021-08-29 MED ORDER — OXYTOCIN BOLUS FROM INFUSION
333.0000 mL | Freq: Once | INTRAVENOUS | Status: AC
Start: 2021-08-29 — End: 2021-08-30
  Administered 2021-08-30: 333 mL via INTRAVENOUS

## 2021-08-29 MED ORDER — SOD CITRATE-CITRIC ACID 500-334 MG/5ML PO SOLN
30.0000 mL | ORAL | Status: DC | PRN
Start: 2021-08-29 — End: 2021-09-01
  Administered 2021-08-30: 30 mL via ORAL
  Filled 2021-08-29: qty 30

## 2021-08-29 MED ORDER — LABETALOL HCL 5 MG/ML IV SOLN: 80.0000 mg | INTRAVENOUS | Status: DC | PRN

## 2021-08-29 MED ORDER — LIDOCAINE HCL (PF) 1 % IJ SOLN
30.0000 mL | INTRAMUSCULAR | Status: DC | PRN
Start: 2021-08-29 — End: 2021-09-01

## 2021-08-29 MED ORDER — TERBUTALINE SULFATE 1 MG/ML IJ SOLN: 0.2500 mg | Freq: Once | INTRAMUSCULAR | Status: DC | PRN

## 2021-08-29 MED ORDER — OXYTOCIN-SODIUM CHLORIDE 30-0.9 UT/500ML-% IV SOLN
1.0000 m[IU]/min | INTRAVENOUS | Status: DC
  Administered 2021-08-29: 2 m[IU]/min via INTRAVENOUS
  Filled 2021-08-29: qty 500

## 2021-08-29 MED ORDER — MISOPROSTOL 25 MCG QUARTER TABLET
25.0000 ug | ORAL_TABLET | ORAL | Status: DC
Start: 2021-08-29 — End: 2021-08-31
  Administered 2021-08-29: 25 ug via VAGINAL
  Filled 2021-08-29 (×4): qty 1

## 2021-08-29 MED ORDER — LABETALOL HCL 5 MG/ML IV SOLN: 20.0000 mg | INTRAVENOUS | Status: DC | PRN

## 2021-08-29 MED ORDER — MISOPROSTOL 50MCG HALF TABLET
50.0000 ug | ORAL_TABLET | ORAL | Status: DC | PRN
  Administered 2021-08-29: 50 ug via BUCCAL
  Filled 2021-08-29: qty 1

## 2021-08-29 MED ORDER — ACETAMINOPHEN 325 MG PO TABS
650.0000 mg | ORAL_TABLET | ORAL | Status: DC | PRN
  Filled 2021-08-29: qty 2

## 2021-08-29 MED ORDER — LACTATED RINGERS IV SOLN
INTRAVENOUS | Status: DC
Start: 2021-08-29 — End: 2021-09-01

## 2021-08-29 MED ORDER — LACTATED RINGERS IV SOLN
500.0000 mL | INTRAVENOUS | Status: DC | PRN
  Administered 2021-08-29: 1000 mL via INTRAVENOUS
  Administered 2021-08-30 (×2): 500 mL via INTRAVENOUS

## 2021-08-29 MED ORDER — ONDANSETRON HCL 4 MG/2ML IJ SOLN
4.0000 mg | Freq: Four times a day (QID) | INTRAMUSCULAR | Status: DC | PRN
  Administered 2021-08-30: 4 mg via INTRAVENOUS
  Filled 2021-08-29: qty 2

## 2021-08-29 MED ORDER — HYDRALAZINE HCL 20 MG/ML IJ SOLN
10.0000 mg | INTRAMUSCULAR | Status: DC | PRN
Start: 2021-08-29 — End: 2021-09-01

## 2021-08-29 MED ORDER — LABETALOL HCL 5 MG/ML IV SOLN: 40.0000 mg | INTRAVENOUS | Status: DC | PRN

## 2021-08-29 MED ORDER — BUTORPHANOL TARTRATE 2 MG/ML IJ SOLN
1.0000 mg | INTRAMUSCULAR | Status: DC | PRN
  Administered 2021-08-29 – 2021-08-30 (×2): 1 mg via INTRAVENOUS
  Filled 2021-08-29 (×2): qty 1

## 2021-08-29 MED ORDER — OXYTOCIN-SODIUM CHLORIDE 30-0.9 UT/500ML-% IV SOLN
2.5000 [IU]/h | INTRAVENOUS | Status: DC
  Filled 2021-08-29: qty 500

## 2021-08-29 NOTE — Interval H&P Note (Signed)
History and Physical Interval Note:  08/29/2021 9:36 AM Pt arrived for IOL and after getting IV placed had a vagal episode with hypotension and concurrent fetal HR deceleration.  Mother and baby quickly recovered with IV fluid bolus and she now feels fine.   FHR category 1.  Cervix 50/cl/-2 Cytotec placed with no issues.    Oliver Pila

## 2021-08-29 NOTE — Progress Notes (Signed)
Patient reported feeling dizzy and lightheaded after PIV insertion attempt. Patient pale and diaphoretic. Patient placed in supine position and IVFB started. Patient had syncopal episode. Safety measures implemented. Blood pressure assessed 121/83, HR 62.   Ten minutes later, patient reports feeling the same way. Blood pressure assessed, 80/46, 62/35, 92/60. MD notified. FHR symptomatic. Patient repositioned, reports feeling better. FHR back to baseline. BP WDL 122/88. MD on way, no new orders.   Nada Boozer [place], RN Rayvon Char, RN

## 2021-08-29 NOTE — Progress Notes (Signed)
Patient ID: AXIE KNOCHE, female   DOB: 03/06/1986, 36 y.o.   MRN: [id number] Pt on 6 mu pitocin and feeling some contractions, mild  Afeb VSS  Cervical exam deferred  Pt seems to be responding to pitocin, will continue and recheck when a bit more uncomfortable.

## 2021-08-29 NOTE — Progress Notes (Signed)
Patient ID: Rachael Hoover, female   DOB: 01/30/1986, 36 y.o.   MRN: [id number] Pt was feeling mild/mod cramping with pitocin  Afeb VSS  FHR category 1  Cervix 60/2/-2/posterior  D/w pt foley bulb but she declines Able to AROM with FSE, scant fluid Will follow progress, latent phase

## 2021-08-29 NOTE — Progress Notes (Signed)
Patient ID: Rachael Hoover, female   DOB: 16-Apr-1985, 36 y.o.   MRN: [id number] Feeling mild cramping s/p buccal cytotec Afeb BP 120/80's  FHR category 1  Cervix 50/1-2/-2, vertex   Will start pitocin and see how responds with contractions.

## 2021-08-29 NOTE — Progress Notes (Signed)
Patient ID: Rachael Hoover, female   DOB: 12/06/1985, 36 y.o.   MRN: [id number] Pt s/p cytotec #1 and feeling mild cramping  FHR category 1 Afeb  Occasional BP to 130/90  Cervix cl/50/-2  D/w pt attempting foley bulb and she prefers another dose of cytotec at this time.  Will do 28mcg buccal.  Reassess in 4 hours

## 2021-08-30 ENCOUNTER — Inpatient Hospital Stay (HOSPITAL_COMMUNITY): Payer: 59 | Admitting: Anesthesiology

## 2021-08-30 ENCOUNTER — Encounter (HOSPITAL_COMMUNITY): Payer: Self-pay | Admitting: Obstetrics and Gynecology

## 2021-08-30 MED ORDER — DIBUCAINE (PERIANAL) 1 % EX OINT: 1.0000 "application " | TOPICAL_OINTMENT | CUTANEOUS | Status: DC | PRN

## 2021-08-30 MED ORDER — FENTANYL-BUPIVACAINE-NACL 0.5-0.125-0.9 MG/250ML-% EP SOLN: 12.0000 mL/h | EPIDURAL | Status: DC | PRN

## 2021-08-30 MED ORDER — ZOLPIDEM TARTRATE 5 MG PO TABS: 5.0000 mg | ORAL_TABLET | Freq: Every evening | ORAL | Status: DC | PRN

## 2021-08-30 MED ORDER — EPHEDRINE 5 MG/ML INJ: 10.0000 mg | INTRAVENOUS | Status: DC | PRN

## 2021-08-30 MED ORDER — LACTATED RINGERS AMNIOINFUSION
INTRAVENOUS | Status: DC
Start: 2021-08-30 — End: 2021-09-01
  Filled 2021-08-30 (×8): qty 1000

## 2021-08-30 MED ORDER — BENZOCAINE-MENTHOL 20-0.5 % EX AERO
1.0000 "application " | INHALATION_SPRAY | CUTANEOUS | Status: DC | PRN
  Administered 2021-08-30: 1 via TOPICAL
  Filled 2021-08-30: qty 56

## 2021-08-30 MED ORDER — IBUPROFEN 600 MG PO TABS
600.0000 mg | ORAL_TABLET | Freq: Four times a day (QID) | ORAL | Status: DC
  Administered 2021-08-30 – 2021-09-01 (×7): 600 mg via ORAL
  Filled 2021-08-30 (×7): qty 1

## 2021-08-30 MED ORDER — TETANUS-DIPHTH-ACELL PERTUSSIS 5-2.5-18.5 LF-MCG/0.5 IM SUSY: 0.5000 mL | PREFILLED_SYRINGE | Freq: Once | INTRAMUSCULAR | Status: DC

## 2021-08-30 MED ORDER — PHENYLEPHRINE 80 MCG/ML (10ML) SYRINGE FOR IV PUSH (FOR BLOOD PRESSURE SUPPORT)
80.0000 ug | PREFILLED_SYRINGE | INTRAVENOUS | Status: DC | PRN
Start: 2021-08-30 — End: 2021-09-01
  Filled 2021-08-30: qty 10

## 2021-08-30 MED ORDER — ONDANSETRON HCL 4 MG/2ML IJ SOLN: 4.0000 mg | INTRAMUSCULAR | Status: DC | PRN

## 2021-08-30 MED ORDER — OXYTOCIN-SODIUM CHLORIDE 30-0.9 UT/500ML-% IV SOLN: 2.5000 [IU]/h | INTRAVENOUS | Status: DC | PRN

## 2021-08-30 MED ORDER — LACTATED RINGERS IV SOLN: 500.0000 mL | Freq: Once | INTRAVENOUS | Status: DC

## 2021-08-30 MED ORDER — FENTANYL-BUPIVACAINE-NACL 0.5-0.125-0.9 MG/250ML-% EP SOLN
12.0000 mL/h | EPIDURAL | Status: DC | PRN
  Administered 2021-08-30: 12 mL/h via EPIDURAL
  Filled 2021-08-30: qty 250

## 2021-08-30 MED ORDER — OXYCODONE HCL 5 MG PO TABS: 10.0000 mg | ORAL_TABLET | ORAL | Status: DC | PRN

## 2021-08-30 MED ORDER — DIPHENHYDRAMINE HCL 50 MG/ML IJ SOLN: 12.5000 mg | INTRAMUSCULAR | Status: DC | PRN

## 2021-08-30 MED ORDER — DIPHENHYDRAMINE HCL 25 MG PO CAPS: 25.0000 mg | ORAL_CAPSULE | Freq: Four times a day (QID) | ORAL | Status: DC | PRN

## 2021-08-30 MED ORDER — PHENYLEPHRINE 80 MCG/ML (10ML) SYRINGE FOR IV PUSH (FOR BLOOD PRESSURE SUPPORT)
80.0000 ug | PREFILLED_SYRINGE | INTRAVENOUS | Status: DC | PRN
Start: 2021-08-30 — End: 2021-09-01
  Administered 2021-08-30 (×2): 80 ug via INTRAVENOUS

## 2021-08-30 MED ORDER — ACETAMINOPHEN 325 MG PO TABS
650.0000 mg | ORAL_TABLET | ORAL | Status: DC | PRN
Start: 2021-08-30 — End: 2021-09-01
  Administered 2021-08-30 – 2021-08-31 (×2): 650 mg via ORAL
  Filled 2021-08-30: qty 2

## 2021-08-30 MED ORDER — PRENATAL MULTIVITAMIN CH
1.0000 | ORAL_TABLET | Freq: Every day | ORAL | Status: DC
  Administered 2021-08-31 – 2021-09-01 (×2): 1 via ORAL
  Filled 2021-08-30 (×2): qty 1

## 2021-08-30 MED ORDER — SENNOSIDES-DOCUSATE SODIUM 8.6-50 MG PO TABS
2.0000 | ORAL_TABLET | Freq: Every day | ORAL | Status: DC
  Administered 2021-08-31 – 2021-09-01 (×2): 2 via ORAL
  Filled 2021-08-30 (×2): qty 2

## 2021-08-30 MED ORDER — ONDANSETRON HCL 4 MG PO TABS: 4.0000 mg | ORAL_TABLET | ORAL | Status: DC | PRN

## 2021-08-30 MED ORDER — COCONUT OIL OIL
1.0000 | TOPICAL_OIL | Status: DC | PRN
Start: 2021-08-30 — End: 2021-09-01
  Administered 2021-08-31: 1 via TOPICAL

## 2021-08-30 MED ORDER — SIMETHICONE 80 MG PO CHEW: 80.0000 mg | CHEWABLE_TABLET | ORAL | Status: DC | PRN

## 2021-08-30 MED ORDER — WITCH HAZEL-GLYCERIN EX PADS: 1.0000 "application " | MEDICATED_PAD | CUTANEOUS | Status: DC | PRN

## 2021-08-30 MED ORDER — LIDOCAINE HCL (PF) 1 % IJ SOLN
INTRAMUSCULAR | Status: DC | PRN
  Administered 2021-08-30: 8 mL via EPIDURAL

## 2021-08-30 MED ORDER — OXYCODONE HCL 5 MG PO TABS: 5.0000 mg | ORAL_TABLET | ORAL | Status: DC | PRN

## 2021-08-30 NOTE — Anesthesia Preprocedure Evaluation (Signed)
Anesthesia Evaluation  Patient identified by MRN, date of birth, ID band Patient awake    Reviewed: Allergy & Precautions, H&P , NPO status , Patient's Chart, lab work & pertinent test results, reviewed documented beta blocker date and time   Airway Mallampati: I  TM Distance: >3 FB Neck ROM: full    Dental no notable dental hx. (+) Teeth Intact, Dental Advisory Given   Pulmonary neg pulmonary ROS,    Pulmonary exam normal breath sounds clear to auscultation       Cardiovascular negative cardio ROS Normal cardiovascular exam Rhythm:regular Rate:Normal     Neuro/Psych Anxiety negative neurological ROS  negative psych ROS   GI/Hepatic negative GI ROS, Neg liver ROS,   Endo/Other  negative endocrine ROS  Renal/GU negative Renal ROS  negative genitourinary   Musculoskeletal   Abdominal   Peds  Hematology negative hematology ROS (+)   Anesthesia Other Findings   Reproductive/Obstetrics (+) Pregnancy                             Anesthesia Physical Anesthesia Plan  ASA: 2  Anesthesia Plan: Epidural   Post-op Pain Management: Minimal or no pain anticipated   Induction:   PONV Risk Score and Plan: 2 and Treatment may vary due to age or medical condition  Airway Management Planned: Natural Airway  Additional Equipment: None  Intra-op Plan:   Post-operative Plan:   Informed Consent: I have reviewed the patients History and Physical, chart, labs and discussed the procedure including the risks, benefits and alternatives for the proposed anesthesia with the patient or authorized representative who has indicated his/her understanding and acceptance.     Dental Advisory Given  Plan Discussed with: Anesthesiologist  Anesthesia Plan Comments: (Labs checked- platelets confirmed with RN in room. Fetal heart tracing, per RN, reported to be stable enough for sitting procedure. Discussed  epidural, and patient consents to the procedure:  included risk of possible headache,backache, failed block, allergic reaction, and nerve injury. This patient was asked if she had any questions or concerns before the procedure started.)        Anesthesia Quick Evaluation

## 2021-08-30 NOTE — Plan of Care (Signed)
  Problem: Education: Goal: Knowledge of condition will improve Outcome: Completed/Met

## 2021-08-30 NOTE — Anesthesia Procedure Notes (Signed)
Epidural Patient location during procedure: OB Start time: 08/30/2021 4:16 AM End time: 08/30/2021 4:20 AM  Staffing Anesthesiologist: Bethena Midget, MD  Preanesthetic Checklist Completed: patient identified, IV checked, site marked, risks and benefits discussed, surgical consent, monitors and equipment checked, pre-op evaluation and timeout performed  Epidural Patient position: sitting Prep: DuraPrep and site prepped and draped Patient monitoring: continuous pulse ox and blood pressure Approach: midline Location: L3-L4 Injection technique: LOR air  Needle:  Needle type: Tuohy  Needle gauge: 17 G Needle length: 9 cm and 9 Needle insertion depth: 5 cm Catheter type: closed end flexible Catheter size: 19 Gauge Catheter at skin depth: 10 cm Test dose: negative  Assessment Events: blood not aspirated, injection not painful, no injection resistance, no paresthesia and negative IV test

## 2021-08-30 NOTE — Progress Notes (Signed)
Patient ID: Rachael Hoover, female   DOB: 1985/06/06, 36 y.o.   MRN: [id number] Pt got very uncomfortable and received epidural at 430am  Afeb VSS  3-4/80/-1  FHR had one variable with cervical check , now back to baseline with good variability and accels IUPC and FSE placed Contractions a little hyperstimulated in appearance so pitocin held x 10 min and restarted at 10 mu

## 2021-08-30 NOTE — Progress Notes (Signed)
Patient ID: Rachael Hoover, female   DOB: 02/22/1986, 36 y.o.   MRN: [id number] Pt with increasing discomfort, not yet feeling needs epidural  Afeb vss FHR category 1  Cervix 2/70/-2 posterior  Patient in latent phase labor, titrating pitocin

## 2021-08-30 NOTE — Lactation Note (Signed)
This note was copied from a baby's chart. Lactation Consultation Note  Patient Name: Rachael Hoover [id number] Date: 08/30/2021 Reason for consult: Initial assessment;Mother's request;Difficult latch;Primapara;1st time breastfeeding;Early term 37-38.6wks;Breastfeeding assistance Age:37 hours  Mom feeding plan to EBF. Infant not able to sustain the latch, holding tongue back. Infant fed colostrum on spoon.   Plan 1. To feed based on cues 8-12x 24hr period. Mom to offer breasts and look for signs of milk transfer.  2. Mom to supplement with EBM 5-7 ml per feeding on spoon 3. I and O sheet reviewed.  All questions answered at the end of the visit.   Maternal Data Has patient been taught Hand Expression?: Yes Does the patient have breastfeeding experience prior to this delivery?: No  Feeding Mother's Current Feeding Choice: Breast Milk  LATCH Score Latch: Repeated attempts needed to sustain latch, nipple held in mouth throughout feeding, stimulation needed to elicit sucking reflex.  Audible Swallowing: None  Type of Nipple: Flat  Comfort (Breast/Nipple): Soft / non-tender  Hold (Positioning): Assistance needed to correctly position infant at breast and maintain latch.  LATCH Score: 5   Lactation Tools Discussed/Used Tools: Flanges;Pump Flange Size: 21;24 Breast pump type: Manual Pump Education: Setup, frequency, and cleaning;Milk Storage Reason for Pumping: elongate nipple Pumping frequency: pre pump 6x each breast prior to latching  Interventions Interventions: Breast feeding basics reviewed;Assisted with latch;Skin to skin;Breast massage;Hand express;Pre-pump if needed;Breast compression;Adjust position;Support pillows;Position options;Expressed milk;Hand pump;Education;LC Psychologist, educational;Infant Driven Feeding Algorithm education  Discharge Pump: Manual  Consult Status Consult Status: Follow-up Date: 08/31/21 Follow-up type: In-patient    Rachael Hoover   Rachael Hoover 08/30/2021, 10:09 PM

## 2021-08-30 NOTE — Progress Notes (Signed)
Patient ID: Rachael Hoover, female   DOB: 21-Jan-1986, 36 y.o.   MRN: [id number] Contractions have been more intense and has had more LOF  Received stadol x 2  Afeb BP normal  Cervical exam deferred  Pitocin at 14 mu

## 2021-08-30 NOTE — Lactation Note (Signed)
This note was copied from a baby's chart. Lactation Consultation Note  Patient Name: Rachael Hoover [id number] Date: 08/30/2021 Reason for consult: L&D Initial assessment;Mother's request;Difficult latch;Primapara;1st time breastfeeding;Early term 37-38.6wks;Breastfeeding assistance;Other (Comment) (IVF) Age:37 hours  Infant amniotic fluid making it hard to latch. LC removed a few times but more came up. Infant left STS licking colostrum off Mom breast. Mom able to hand express and offer colostrum on a spoon when ready.  Mom to receive further support on the floor.   Maternal Data Has patient been taught Hand Expression?: Yes Does the patient have breastfeeding experience prior to this delivery?: No  Feeding Mother's Current Feeding Choice: Breast Milk  LATCH Score Latch: Repeated attempts needed to sustain latch, nipple held in mouth throughout feeding, stimulation needed to elicit sucking reflex.  Audible Swallowing: None  Type of Nipple: Everted at rest and after stimulation  Comfort (Breast/Nipple): Soft / non-tender  Hold (Positioning): Assistance needed to correctly position infant at breast and maintain latch.  LATCH Score: 6   Lactation Tools Discussed/Used    Interventions Interventions: Breast feeding basics reviewed;Assisted with latch;Skin to skin;Breast massage;Hand express;Breast compression;Adjust position;Expressed milk;Education  Discharge    Consult Status Consult Status: Follow-up Date: 08/31/21 Follow-up type: In-patient    Xan Sparkman  Nicholson-Springer 08/30/2021, 7:10 PM

## 2021-08-31 ENCOUNTER — Encounter (HOSPITAL_COMMUNITY): Payer: Self-pay | Admitting: Obstetrics and Gynecology

## 2021-08-31 LAB — CBC
HCT: 30.8 % — ABNORMAL LOW (ref 36.0–46.0)
Hemoglobin: 10.4 g/dL — ABNORMAL LOW (ref 12.0–15.0)
MCH: 30.8 pg (ref 26.0–34.0)
MCHC: 33.8 g/dL (ref 30.0–36.0)
MCV: 91.1 fL (ref 80.0–100.0)
Platelets: 190 10*3/uL (ref 150–400)
RBC: 3.38 MIL/uL — ABNORMAL LOW (ref 3.87–5.11)
RDW: 12.5 % (ref 11.5–15.5)
WBC: 16.1 10*3/uL — ABNORMAL HIGH (ref 4.0–10.5)
nRBC: 0 % (ref 0.0–0.2)

## 2021-08-31 NOTE — Lactation Note (Signed)
This note was copied from a baby's chart. Lactation Consultation Note  Patient Name: Rachael Hoover [id number] Date: 08/31/2021 Reason for consult: Mother's request;Breastfeeding assistance Age:37 hours  LC in to room per mother's request. Assisted with latch and noted infant unable to sustain latch. Demonstrated sucking exercises using expressed breast milk.  May need a nipple shield to assist with latch. Encouraged using hand pump nipple eversion and stimulation.   Feeding plan:  1-Feeding on demand or 8-12 times in 24h period.  2-Encouraged maternal rest, hydration and food intake.   Contact LC as needed for feeds/support/concerns/questions. All questions answered at this time.     Maternal Data Has patient been taught Hand Expression?: Yes Does the patient have breastfeeding experience prior to this delivery?: No  Feeding Mother's Current Feeding Choice: Breast Milk  LATCH Score Latch: Repeated attempts needed to sustain latch, nipple held in mouth throughout feeding, stimulation needed to elicit sucking reflex.  Audible Swallowing: None  Type of Nipple: Everted at rest and after stimulation (short shafted)  Comfort (Breast/Nipple): Soft / non-tender  Hold (Positioning): Assistance needed to correctly position infant at breast and maintain latch.  LATCH Score: 6   Lactation Tools Discussed/Used Tools: Pump Flange Size: 21 (may need a 19-mm) Breast pump type: Manual Reason for Pumping: nipple eversion, stimulation and supplementation Pumping frequency: as needed  Interventions Interventions: Assisted with latch;Hand express;Breast massage;Education;Pre-pump if needed;Adjust position;Support pillows;Breast compression  Discharge Pump: Manual;Personal  Consult Status Consult Status: Follow-up Date: 09/01/21 Follow-up type: In-patient    Shiloh Southern A Higuera Ancidey 08/31/2021, 11:45 AM

## 2021-08-31 NOTE — Progress Notes (Signed)
MOB was referred for history of depression/anxiety. * Referral screened out by Clinical Social Worker because none of the following criteria appear to apply: ~ History of anxiety/depression during this pregnancy, or of post-partum depression following prior delivery. ~ Diagnosis of anxiety and/or depression within last 3 years (dx in 2007)/ OR * MOB's symptoms currently being treated with medication and/or therapy. Per chart review, MOB has a therapist and no concerns were noted in OB records.   Please contact the Clinical Social Worker if needs arise, by [facility] request, or if MOB scores greater than 9/yes to question 10 on [place] Postpartum Depression Screen.   Blaine Hamper, MSW, LCSW Clinical Social Work [phone]

## 2021-08-31 NOTE — Plan of Care (Signed)
  Problem: Education: Goal: Knowledge of General Education information will improve Description: Including pain rating scale, medication(s)/side effects and non-pharmacologic comfort measures Outcome: Completed/Met   Problem: Education: Goal: Knowledge of condition will improve Outcome: Completed/Met

## 2021-08-31 NOTE — Anesthesia Postprocedure Evaluation (Signed)
Anesthesia Post Note  Patient: Rachael Hoover  Procedure(s) Performed: AN AD HOC LABOR EPIDURAL     Patient location during evaluation: Mother Baby Anesthesia Type: Epidural Level of consciousness: awake and alert Pain management: pain level controlled Vital Signs Assessment: post-procedure vital signs reviewed and stable Respiratory status: spontaneous breathing, nonlabored ventilation and respiratory function stable Cardiovascular status: stable Postop Assessment: no headache, no backache and epidural receding Anesthetic complications: no   No notable events documented.  Last Vitals:  Vitals:   08/31/21 0900 08/31/21 1428  BP: 104/77 120/83  Pulse: 60 65  Resp: 18 20  Temp: 36.7 C 36.7 C  SpO2: 100%     Last Pain:  Vitals:   08/31/21 1428  TempSrc: Axillary  PainSc:    Pain Goal:                   Rica Records

## 2021-08-31 NOTE — Progress Notes (Signed)
Patient is eating, ambulating, voiding.  Pain control is good.  Appropriate lochia, no complaints.  Vitals:   08/30/21 2112 08/31/21 0120 08/31/21 0452 08/31/21 0900  BP: 105/72 102/66 113/74 104/77  Pulse: 65 (!) 50 (!) 50 60  Resp: 18 18 18 18   Temp: 98.7 F (37.1 C) 98.3 F (36.8 C) 97.8 F (36.6 C) 98 F (36.7 C)  TempSrc: Oral Oral Oral Oral  SpO2: 100% 98%  100%  Weight:      Height:        Fundus firm Ext: no calf tenderness  Lab Results  Component Value Date   WBC 16.1 (H) 08/31/2021   HGB 10.4 (L) 08/31/2021   HCT 30.8 (L) 08/31/2021   MCV 91.1 08/31/2021   PLT 190 08/31/2021    --/--/O NEG (05/18 [id number])  A/P Post partum day 1. Doing well, no complaints.  Routine care.    Allyn Kenner

## 2021-08-31 NOTE — Lactation Note (Signed)
This note was copied from a baby's chart. Lactation Consultation Note  Patient Name: Rachael Hoover [id number] Date: 08/31/2021 Reason for consult: Follow-up assessment;Difficult latch;Primapara;1st time breastfeeding;Early term 37-38.6wks Age:37 hours  LC in to room for follow up. "Rachael Hoover" is sleeping upon arrival. Mother states latch is challenging because Rachael Hoover does not sustain latch. Mother is expressing, collecting ~5 mL and spoonfeeding. Rachael Hoover has been having good output.   Mother reports discomfort when using the manual pump. May need a smaller flange size, currently has a 21-mm. Parents will call for latch check.  Reviewed normal newborn behavior.   Plan: 1-Feeding on demand or 8-12 times in 24h period. 2-Continue hand expression/hand pump use 3-Reinforce maternal rest, hydration and food intake.   Contact LC as needed for feeds/support/concerns/questions. All questions answered at this time.    Maternal Data Has patient been taught Hand Expression?: Yes Does the patient have breastfeeding experience prior to this delivery?: No  Feeding Mother's Current Feeding Choice: Breast Milk  Interventions Interventions: Breast feeding basics reviewed;Skin to skin;Breast massage;Hand express;Expressed milk;Education  Consult Status Consult Status: Follow-up Date: 08/31/21 Follow-up type: In-patient    Nicolet Griffy A Higuera Ancidey 08/31/2021, 10:56 AM

## 2021-09-01 NOTE — Lactation Note (Signed)
This note was copied from a baby's chart. Lactation Consultation Note Mom BF when LC came into rm. On Lt. Side. Mom stated she has been having trouble latching to Lt. Side. RN assisting in obtaining deep latch to Lt. W/mom. Discussed positioning, support, and body alignment. Repositioned to football hold to Lt. Side. Suggested mom use football hold on Lt. For now. Nipple is compressible at this time but LC is concerned mom will need NS for Lt. Side when her milk comes in. Discussed w/mom pre-pumping and using finger stimulation to soften nipple to evert before latching. For now mom is able to latch.  Noted good breast compressions. Encouraged occasional breast compression during feedings. Gave mom shells and strongly encouraged mom to wear in am. Praised mom for doing a good job BF. Demonstrated lip flange and chin tug. Baby does have labial frenulum. Noted baby tongue to lips. Encouraged to call for assistance as needed.  Patient Name: Rachael Hoover [id number] Date: 09/01/2021 Reason for consult: Follow-up assessment;Primapara;Early term 37-38.6wks Age:29 hours  Maternal Data Has patient been taught Hand Expression?: Yes Does the patient have breastfeeding experience prior to this delivery?: No  Feeding    LATCH Score Latch: Grasps breast easily, tongue down, lips flanged, rhythmical sucking.  Audible Swallowing: A few with stimulation  Type of Nipple: Flat (Rt/short shaft/inverted center. Lt/flat)  Comfort (Breast/Nipple): Soft / non-tender (nipple compressible at this time)  Hold (Positioning): Assistance needed to correctly position infant at breast and maintain latch.  LATCH Score: 7   Lactation Tools Discussed/Used Tools: Shells;Pump Breast pump type: Manual Reason for Pumping: pre-pumping  Interventions Interventions: Breast feeding basics reviewed;Assisted with latch;Skin to skin;Breast massage;Hand express;Pre-pump if needed;Breast compression;Adjust  position;Support pillows;Position options;Shells;Hand pump;Coconut oil  Discharge    Consult Status Consult Status: Follow-up Date: 09/01/21 Follow-up type: In-patient    Charyl Dancer 09/01/2021, 12:28 AM

## 2021-09-01 NOTE — Discharge Summary (Signed)
Postpartum Discharge Summary     Patient Name: Rachael Hoover DOB: 22-Jul-1985 MRN: [id number]  Date of admission: 08/29/2021 Delivery date:08/30/2021  Delivering provider: Vanessa Kick  Date of discharge: 09/01/2021  Admitting diagnosis: Gestational hypertension without significant proteinuria during pregnancy in third trimester, antepartum [O13.3] Spontaneous vaginal delivery [O80] Intrauterine pregnancy: [redacted]w[redacted]d    Secondary diagnosis:  Principal Problem:   Gestational hypertension without significant proteinuria during pregnancy in third trimester, antepartum Active Problems:   Spontaneous vaginal delivery  Additional problems: anxiety, h.o stage 4 endometriosis, RH neg, AMA    Discharge diagnosis: Term Pregnancy Delivered and Gestational Hypertension                                              Post partum procedures:rhogam Augmentation: AROM, Pitocin, and Cytotec Complications: None  Hospital course: Induction of Labor With Vaginal Delivery   36y.o. yo G1P1001 at 321w5das admitted to the hospital 08/29/2021 for induction of labor.  Indication for induction: Gestational hypertension.  Patient had an uncomplicated labor course as follows: Membrane Rupture Time/Date: 9:46 PM ,08/29/2021   Delivery Method: vacuum assisted vaginal delivery Episiotomy: None  Lacerations:  Vaginal;1st degree  Details of delivery can be found in separate delivery note.  Patient had a routine postpartum course. Patient is discharged home 09/01/21.  Newborn Data: Birth date:08/30/2021  Birth time:5:58 PM  Gender:Female  Living status:Living  Apgars:8 ,9  Weight:3240 g   Magnesium Sulfate received: No BMZ received: No Rhophylac:Yes MMR:No T-DaP: see office note Flu: N/A Transfusion:No  Physical exam  Vitals:   08/31/21 0900 08/31/21 1428 08/31/21 1934 09/01/21 0500  BP: 104/77 120/83 112/78 100/78  Pulse: 60 65 (!) 54 (!) 46  Resp: 18 20 18 18   Temp: 98 F (36.7 C) 98 F (36.7 C)  98.4 F (36.9 C) 97.8 F (36.6 C)  TempSrc: Oral Axillary Oral Oral  SpO2: 100%  98%   Weight:      Height:       General: alert, cooperative, and no distress Lochia: appropriate Uterine Fundus: firm Incision: N/A DVT Evaluation: No evidence of DVT seen on physical exam. Labs: Lab Results  Component Value Date   WBC 16.1 (H) 08/31/2021   HGB 10.4 (L) 08/31/2021   HCT 30.8 (L) 08/31/2021   MCV 91.1 08/31/2021   PLT 190 08/31/2021      Latest Ref Rng & Units 08/29/2021    8:08 AM  CMP  Glucose 70 - 99 mg/dL 85    BUN 6 - 20 mg/dL 11    Creatinine 0.44 - 1.00 mg/dL 0.95    Sodium 135 - 145 mmol/L 139    Potassium 3.5 - 5.1 mmol/L 3.5    Chloride 98 - 111 mmol/L 109    CO2 22 - 32 mmol/L 21    Calcium 8.9 - 10.3 mg/dL 8.9    Total Protein 6.5 - 8.1 g/dL 6.3    Total Bilirubin 0.3 - 1.2 mg/dL 0.6    Alkaline Phos 38 - 126 U/L 140    AST 15 - 41 U/L 23    ALT 0 - 44 U/L 17     Edinburgh Score:    08/31/2021    7:36 PM  Edinburgh Postnatal Depression Scale Screening Tool  I have been able to laugh and see the funny side of things. 0  I have looked forward with enjoyment to things. 0  I have blamed myself unnecessarily when things went wrong. 1  I have been anxious or worried for no good reason. 1  I have felt scared or panicky for no good reason. 0  Things have been getting on top of me. 0  I have been so unhappy that I have had difficulty sleeping. 0  I have felt sad or miserable. 0  I have been so unhappy that I have been crying. 0  The thought of harming myself has occurred to me. 0  Edinburgh Postnatal Depression Scale Total 2      After visit meds:  Allergies as of 09/01/2021       Reactions   Hydrocodone-acetaminophen Nausea And Vomiting   Patient states she takes tylenol by itself with no reaction        Medication List     STOP taking these medications    ALPRAZolam 0.5 MG 24 hr tablet Commonly known as: XANAX XR   aspirin EC 81 MG tablet    B-6 PO   clonazePAM 0.5 MG tablet Commonly known as: KLONOPIN   cyclobenzaprine 5 MG tablet Commonly known as: FLEXERIL   fexofenadine-pseudoephedrine 60-120 MG 12 hr tablet Commonly known as: ALLEGRA-D   fluticasone 50 MCG/ACT nasal spray Commonly known as: FLONASE       TAKE these medications    ergocalciferol 1.25 MG (50000 UT) capsule Commonly known as: VITAMIN D2 ergocalciferol (vitamin D2) 1,250 mcg (50,000 unit) capsule  Take 1 capsule every week by oral route.   prenatal multivitamin Tabs tablet Take 1 tablet by mouth daily at 12 noon.         Discharge home in stable condition Infant Feeding:  see peds note Infant Disposition:home with mother Discharge instruction: per After Visit Summary and Postpartum booklet. Activity: Advance as tolerated. Pelvic rest for 6 weeks.  Diet: routine diet Anticipated Birth Control: Unsure Postpartum Appointment: call office for BP check and postpartum visit Additional Postpartum F/U: Postpartum Depression checkup and BP check pt will call office Monday to schedule Future Appointments:No future appointments. Follow up Visit:  [place]. Call in 4 week(s).   Contact information: [address]  [address] [place] [address] [phone]                     09/01/2021 Allyn Kenner, DO

## 2021-09-01 NOTE — Lactation Note (Signed)
This note was copied from a baby's chart. Lactation Consultation Note  Patient Name: Rachael Hoover [id number] Date: 09/01/2021 Reason for consult: Follow-up assessment;Infant weight loss;Other (Comment);Primapara;1st time breastfeeding;Early term 37-38.6wks (9 % weight loss ( voids and stools QS for the last 24 hours and since birth).) Age:37 hours LC reviewed the breast feeding goals for 24 hours - feed with feeding cues( 8-12 times a day), STS until the baby is back to birth weight gaining steadily and can stay awake for majority of the feeding.  LC recommended due to the 8 % weight loss , by 3 hours off the breast if the baby is not showing feeding cues.  Prior to latching pre -pump to prime the milk ducts with the hand pump .  After feedings when the baby isn't cluster feeding , post pump both breast for 10 -15 mins for added stimulation and spoon feed back to the baby.  LC discussed with mom if the weight is not stable or gained tomorrow at the [facility] appt it will be recommended to supplement to keep the energy level up of the baby to enhance her being more efficient with feedings.  Mom aware of the [facility] resources after D/C.   Maternal Data    Feeding Mother's Current Feeding Choice: Breast Milk  LATCH Score                    Lactation Tools Discussed/Used Tools: Shells;Pump;Flanges Flange Size: 21 Breast pump type: Manual Pump Education: Milk Storage  Interventions Interventions: Breast feeding basics reviewed;Shells;Education;Hand pump;LC Services brochure  Discharge Discharge Education: Engorgement and breast care Pump: DEBP;Manual;Personal WIC Program: No  Consult Status Consult Status: Complete Date: 09/01/21    Kathrin Greathouse 09/01/2021, 10:07 AM

## 2021-09-01 NOTE — Plan of Care (Signed)
  Problem: Health Behavior/Discharge Planning: Goal: Ability to manage health-related needs will improve Outcome: Completed/Met   Problem: Clinical Measurements: Goal: Ability to maintain clinical measurements within normal limits will improve Outcome: Completed/Met Goal: Respiratory complications will improve Outcome: Completed/Met Goal: Cardiovascular complication will be avoided Outcome: Completed/Met   Problem: Activity: Goal: Risk for activity intolerance will decrease Outcome: Completed/Met   Problem: Nutrition: Goal: Adequate nutrition will be maintained Outcome: Completed/Met   Problem: Elimination: Goal: Will not experience complications related to bowel motility Outcome: Completed/Met Goal: Will not experience complications related to urinary retention Outcome: Completed/Met   Problem: Pain Managment: Goal: General experience of comfort will improve Outcome: Completed/Met   Problem: Safety: Goal: Ability to remain free from injury will improve Outcome: Completed/Met   Problem: Skin Integrity: Goal: Risk for impaired skin integrity will decrease Outcome: Completed/Met   Problem: Education: Goal: Individualized Educational Video(s) Outcome: Completed/Met   Problem: Activity: Goal: Will verbalize the importance of balancing activity with adequate rest periods Outcome: Completed/Met Goal: Ability to tolerate increased activity will improve Outcome: Completed/Met   Problem: Coping: Goal: Ability to identify and utilize available resources and services will improve Outcome: Completed/Met   Problem: Life Cycle: Goal: Chance of risk for complications during the postpartum period will decrease Outcome: Completed/Met   Problem: Role Relationship: Goal: Ability to demonstrate positive interaction with newborn will improve Outcome: Completed/Met   Problem: Skin Integrity: Goal: Demonstration of wound healing without infection will improve Outcome:  Completed/Met   Problem: Education: Goal: Knowledge of condition will improve Outcome: Completed/Met   Problem: Activity: Goal: Will verbalize the importance of balancing activity with adequate rest periods Outcome: Completed/Met Goal: Ability to tolerate increased activity will improve Outcome: Completed/Met   Problem: Coping: Goal: Ability to identify and utilize available resources and services will improve Outcome: Completed/Met   Problem: Life Cycle: Goal: Chance of risk for complications during the postpartum period will decrease Outcome: Completed/Met   Problem: Role Relationship: Goal: Ability to demonstrate positive interaction with newborn will improve Outcome: Completed/Met   Problem: Skin Integrity: Goal: Demonstration of wound healing without infection will improve Outcome: Completed/Met

## 2021-09-04 ENCOUNTER — Inpatient Hospital Stay (HOSPITAL_COMMUNITY): Payer: 59

## 2021-09-04 ENCOUNTER — Inpatient Hospital Stay (HOSPITAL_COMMUNITY)
Admission: AD | Admit: 2021-09-04 | Discharge: 2021-09-06 | DRG: 776 | Disposition: A | Discharge status: Home / Self Care | Payer: 59 | Attending: Obstetrics and Gynecology | Admitting: Obstetrics and Gynecology

## 2021-09-04 ENCOUNTER — Encounter (HOSPITAL_COMMUNITY): Payer: Self-pay | Admitting: Obstetrics and Gynecology

## 2021-09-04 DIAGNOSIS — O903 Peripartum cardiomyopathy: Secondary | ICD-10-CM

## 2021-09-04 DIAGNOSIS — O135 Gestational [pregnancy-induced] hypertension without significant proteinuria, complicating the puerperium: Secondary | ICD-10-CM | POA: Diagnosis present

## 2021-09-04 DIAGNOSIS — O99893 Other specified diseases and conditions complicating puerperium: Secondary | ICD-10-CM | POA: Diagnosis not present

## 2021-09-04 DIAGNOSIS — O9279 Other disorders of lactation: Secondary | ICD-10-CM | POA: Diagnosis present

## 2021-09-04 DIAGNOSIS — I361 Nonrheumatic tricuspid (valve) insufficiency: Secondary | ICD-10-CM | POA: Diagnosis not present

## 2021-09-04 DIAGNOSIS — I5041 Acute combined systolic (congestive) and diastolic (congestive) heart failure: Secondary | ICD-10-CM | POA: Diagnosis not present

## 2021-09-04 DIAGNOSIS — I340 Nonrheumatic mitral (valve) insufficiency: Secondary | ICD-10-CM | POA: Diagnosis not present

## 2021-09-04 DIAGNOSIS — J811 Chronic pulmonary edema: Secondary | ICD-10-CM | POA: Diagnosis not present

## 2021-09-04 DIAGNOSIS — J90 Pleural effusion, not elsewhere classified: Secondary | ICD-10-CM | POA: Diagnosis not present

## 2021-09-04 DIAGNOSIS — R001 Bradycardia, unspecified: Secondary | ICD-10-CM | POA: Diagnosis not present

## 2021-09-04 DIAGNOSIS — R0602 Shortness of breath: Secondary | ICD-10-CM | POA: Diagnosis not present

## 2021-09-04 DIAGNOSIS — I959 Hypotension, unspecified: Secondary | ICD-10-CM | POA: Diagnosis not present

## 2021-09-04 LAB — COMPREHENSIVE METABOLIC PANEL
ALT: 35 U/L (ref 0–44)
AST: 31 U/L (ref 15–41)
Albumin: 2.6 g/dL — ABNORMAL LOW (ref 3.5–5.0)
Alkaline Phosphatase: 99 U/L (ref 38–126)
Anion gap: 4 — ABNORMAL LOW (ref 5–15)
BUN: 12 mg/dL (ref 6–20)
CO2: 26 mmol/L (ref 22–32)
Calcium: 8.4 mg/dL — ABNORMAL LOW (ref 8.9–10.3)
Chloride: 110 mmol/L (ref 98–111)
Creatinine, Ser: 0.87 mg/dL (ref 0.44–1.00)
GFR, Estimated: >60 mL/min (ref >60)
Glucose, Bld: 77 mg/dL (ref 70–99)
Potassium: 4 mmol/L (ref 3.5–5.1)
Sodium: 140 mmol/L (ref 135–145)
Total Bilirubin: 0.6 mg/dL (ref 0.3–1.2)
Total Protein: 5.4 g/dL — ABNORMAL LOW (ref 6.5–8.1)

## 2021-09-04 LAB — TROPONIN I (HIGH SENSITIVITY)
Troponin I (High Sensitivity): 7 ng/L (ref <18)
Troponin I (High Sensitivity): 21 ng/L — ABNORMAL HIGH (ref <18)

## 2021-09-04 LAB — T4, FREE: Free T4: 0.94 ng/dL (ref 0.61–1.12)

## 2021-09-04 LAB — CBC WITH DIFFERENTIAL/PLATELET
Abs Immature Granulocytes: 0.04 10*3/uL (ref 0.00–0.07)
Basophils Absolute: 0 10*3/uL (ref 0.0–0.1)
Basophils Relative: 0 %
Eosinophils Absolute: 0.1 10*3/uL (ref 0.0–0.5)
Eosinophils Relative: 1 %
HCT: 34.5 % — ABNORMAL LOW (ref 36.0–46.0)
Hemoglobin: 11.3 g/dL — ABNORMAL LOW (ref 12.0–15.0)
Immature Granulocytes: 0 %
Lymphocytes Relative: 18 %
Lymphs Abs: 1.6 10*3/uL (ref 0.7–4.0)
MCH: 30 pg (ref 26.0–34.0)
MCHC: 32.8 g/dL (ref 30.0–36.0)
MCV: 91.5 fL (ref 80.0–100.0)
Monocytes Absolute: 0.5 10*3/uL (ref 0.1–1.0)
Monocytes Relative: 6 %
Neutro Abs: 6.9 10*3/uL (ref 1.7–7.7)
Neutrophils Relative %: 75 %
Platelets: 264 10*3/uL (ref 150–400)
RBC: 3.77 MIL/uL — ABNORMAL LOW (ref 3.87–5.11)
RDW: 12.7 % (ref 11.5–15.5)
WBC: 9.3 10*3/uL (ref 4.0–10.5)
nRBC: 0 % (ref 0.0–0.2)

## 2021-09-04 LAB — BRAIN NATRIURETIC PEPTIDE: B Natriuretic Peptide: 395.7 pg/mL — ABNORMAL HIGH (ref 0.0–100.0)

## 2021-09-04 LAB — TSH: TSH: 1.83 u[IU]/mL (ref 0.350–4.500)

## 2021-09-04 MED ORDER — SODIUM CHLORIDE 0.9% FLUSH: 3.0000 mL | INTRAVENOUS | Status: DC | PRN

## 2021-09-04 MED ORDER — LABETALOL HCL 5 MG/ML IV SOLN: 20.0000 mg | INTRAVENOUS | Status: DC | PRN

## 2021-09-04 MED ORDER — MAGNESIUM SULFATE 40 GM/1000ML IV SOLN
2.0000 g/h | INTRAVENOUS | Status: DC
  Administered 2021-09-04: 2 g/h via INTRAVENOUS
  Filled 2021-09-04: qty 1000

## 2021-09-04 MED ORDER — SODIUM CHLORIDE 0.9 % IV SOLN: 250.0000 mL | INTRAVENOUS | Status: DC | PRN

## 2021-09-04 MED ORDER — IOHEXOL 350 MG/ML SOLN
80.0000 mL | Freq: Once | INTRAVENOUS | Status: AC | PRN
  Administered 2021-09-04: 80 mL via INTRAVENOUS

## 2021-09-04 MED ORDER — HYDRALAZINE HCL 20 MG/ML IJ SOLN: 10.0000 mg | INTRAMUSCULAR | Status: DC | PRN

## 2021-09-04 MED ORDER — MAGNESIUM SULFATE BOLUS VIA INFUSION
4.0000 g | Freq: Once | INTRAVENOUS | Status: AC
  Administered 2021-09-04: 4 g via INTRAVENOUS
  Filled 2021-09-04: qty 1000

## 2021-09-04 MED ORDER — HYDRALAZINE HCL 20 MG/ML IJ SOLN
INTRAMUSCULAR | Status: AC
Start: 2021-09-04 — End: 2021-09-05
  Filled 2021-09-04: qty 1

## 2021-09-04 MED ORDER — FUROSEMIDE 10 MG/ML IJ SOLN
40.0000 mg | Freq: Once | INTRAMUSCULAR | Status: AC
  Administered 2021-09-04: 40 mg via INTRAVENOUS
  Filled 2021-09-04 (×2): qty 4

## 2021-09-04 MED ORDER — LABETALOL HCL 5 MG/ML IV SOLN: 40.0000 mg | INTRAVENOUS | Status: DC | PRN

## 2021-09-04 MED ORDER — SENNOSIDES-DOCUSATE SODIUM 8.6-50 MG PO TABS: 1.0000 | ORAL_TABLET | Freq: Every evening | ORAL | Status: DC | PRN

## 2021-09-04 MED ORDER — PRENATAL MULTIVITAMIN CH
1.0000 | ORAL_TABLET | Freq: Every day | ORAL | Status: DC
  Administered 2021-09-05 – 2021-09-06 (×2): 1 via ORAL
  Filled 2021-09-04 (×2): qty 1

## 2021-09-04 MED ORDER — HYDRALAZINE HCL 20 MG/ML IJ SOLN
10.0000 mg | Freq: Once | INTRAMUSCULAR | Status: AC
  Administered 2021-09-04: 10 mg via INTRAVENOUS

## 2021-09-04 MED ORDER — IBUPROFEN 600 MG PO TABS: 600.0000 mg | ORAL_TABLET | Freq: Four times a day (QID) | ORAL | Status: DC | PRN

## 2021-09-04 MED ORDER — SODIUM CHLORIDE 0.9% FLUSH
3.0000 mL | Freq: Two times a day (BID) | INTRAVENOUS | Status: DC
  Administered 2021-09-05 – 2021-09-06 (×2): 3 mL via INTRAVENOUS

## 2021-09-04 MED ORDER — OXYCODONE HCL 5 MG PO TABS: 5.0000 mg | ORAL_TABLET | ORAL | Status: DC | PRN

## 2021-09-04 MED ORDER — ACETAMINOPHEN 325 MG PO TABS
650.0000 mg | ORAL_TABLET | Freq: Four times a day (QID) | ORAL | Status: DC | PRN
  Administered 2021-09-04 – 2021-09-05 (×2): 650 mg via ORAL
  Filled 2021-09-04 (×2): qty 2

## 2021-09-04 MED ORDER — ATROPINE SULFATE 1 MG/10ML IJ SOSY
0.5000 mg | PREFILLED_SYRINGE | Freq: Once | INTRAMUSCULAR | Status: AC
  Administered 2021-09-04: 0.5 mg via INTRAVENOUS

## 2021-09-04 MED ORDER — LABETALOL HCL 5 MG/ML IV SOLN: 80.0000 mg | INTRAVENOUS | Status: DC | PRN

## 2021-09-04 MED ORDER — LACTATED RINGERS IV SOLN: INTRAVENOUS | Status: DC

## 2021-09-04 NOTE — MAU Note (Signed)
Rachael Hoover is a 36 y.o. at Unknown here in MAU reporting: vag del on 5/19.  Started feeling kind of crummy on Monday.  Watch alerted her that her pulse was in the mid 30's.   Continued to remain there.  Called dr, was told to check her pulse oxygen level, was 98%, pulse was still in the mid 30's. When she lays down, she feels like her "chest is caving in ".  Feels short of breath when she lays down. Sometimes feels ike her heart skips a beat when she lays down. When she coughs she has pain in her lower chest/ upper abd.  Pt says pulse normally is in the 60's, she is a distance runner.   "Below the waist she is doing fine".  Onset of complaint: Monday Pain score: moderate- chest Vitals:   09/04/21 1248 09/04/21 1250  BP:  (!) 143/81  Pulse:  (!) 45  Resp:  18  Temp:  98.5 F (36.9 C)  SpO2: 100% 100%      Lab orders placed from triage:  EKG

## 2021-09-04 NOTE — Consult Note (Signed)
Referring Physician: Waynard Reeds, MD  Rachael Hoover is an 36 y.o. female.                       Chief Complaint: Chest pain and shortness of breath with sinus bradycardia  HPI: 36 years old white female with PMH of Gestational hypertension during pregnancy in third trimester, delivered baby by spontaneous vaginal delivery 5 days back. Today she has sinus bradycardia and shortness of breath with mild leg edema. She has orthopnea and exertional dyspnea. She also has chest pressure without radiation of pain. Her heart rate improves with IV 0.5 mg. Atropine x 1 and IV hydralazine 10 mg. X 1.Her BP and heart rate normalized. Patient feels better now.  Echocardiogram shows mild LV systolic dysfunction with moderate MR, TR and dilated IVC. Normal aortic root and no AI. 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 Chest x-ray is positive for mild pulmonary edema. Blood work like Troponin I, BNP, TSH are pending.    Past Medical History:  Diagnosis Date   Anxiety 2007   Hypertension 2023   gestational   Infertility, female    Newborn product of in vitro fertilization (IVF) pregnancy       Past Surgical History:  Procedure Laterality Date   HERNIA REPAIR     when a small child - inguinal ?   ivf     TONSILLECTOMY      Family History  Problem Relation Age of Onset   Anxiety disorder Mother    Hypertension Father    Social History:  reports that she has never smoked. She has never used smokeless tobacco. She reports that she does not currently use alcohol. She reports that she does not use drugs.  Allergies:  Allergies  Allergen Reactions   Hydrocodone-Acetaminophen Nausea And Vomiting    Patient states she takes tylenol by itself with no reaction "Violent"vomiting    Medications Prior to Admission  Medication Sig Dispense Refill   ibuprofen (ADVIL) 200 MG tablet Take 400 mg by mouth every 6 (six) hours as needed. 200mg  x2     Prenatal Vit-Fe Fumarate-FA (PRENATAL MULTIVITAMIN)  TABS tablet Take 1 tablet by mouth daily at 12 noon.     ergocalciferol (VITAMIN D2) 1.25 MG (50000 UT) capsule ergocalciferol (vitamin D2) 1,250 mcg (50,000 unit) capsule  Take 1 capsule every week by oral route. (Patient not taking: Reported on 08/19/2021)      Results for orders placed or performed during the hospital encounter of 09/04/21 (from the past 48 hour(s))  Comprehensive metabolic panel     Status: Abnormal   Collection Time: 09/04/21  1:08 PM  Result Value Ref Range   Sodium 140 135 - 145 mmol/L   Potassium 4.0 3.5 - 5.1 mmol/L   Chloride 110 98 - 111 mmol/L   CO2 26 22 - 32 mmol/L   Glucose, Bld 77 70 - 99 mg/dL    Comment: Glucose reference range applies only to samples taken after fasting for at least 8 hours.   BUN 12 6 - 20 mg/dL   Creatinine, Ser 09/06/21 0.44 - 1.00 mg/dL   Calcium 8.4 (L) 8.9 - 10.3 mg/dL   Total Protein 5.4 (L) 6.5 - 8.1 g/dL   Albumin 2.6 (L) 3.5 - 5.0 g/dL   AST 31 15 - 41 U/L   ALT 35 0 - 44 U/L   Alkaline Phosphatase 99 38 - 126 U/L   Total Bilirubin 0.6 0.3 - 1.2 mg/dL  GFR, Estimated >60 >60 mL/min    Comment: (NOTE) Calculated using the CKD-EPI Creatinine Equation (2021)    Anion gap 4 (L) 5 - 15    Comment: Performed at [facility], [address] [place], [place] [address]  CBC with Differential/Platelet     Status: Abnormal   Collection Time: 09/04/21  1:08 PM  Result Value Ref Range   WBC 9.3 4.0 - 10.5 K/uL   RBC 3.77 (L) 3.87 - 5.11 MIL/uL   Hemoglobin 11.3 (L) 12.0 - 15.0 g/dL   HCT 60.1 (L) 09.3 - 23.5 %   MCV 91.5 80.0 - 100.0 fL   MCH 30.0 26.0 - 34.0 pg   MCHC 32.8 30.0 - 36.0 g/dL   RDW 57.3 22.0 - 25.4 %   Platelets 264 150 - 400 K/uL   nRBC 0.0 0.0 - 0.2 %   Neutrophils Relative % 75 %   Neutro Abs 6.9 1.7 - 7.7 K/uL   Lymphocytes Relative 18 %   Lymphs Abs 1.6 0.7 - 4.0 K/uL   Monocytes Relative 6 %   Monocytes Absolute 0.5 0.1 - 1.0 K/uL   Eosinophils Relative 1 %   Eosinophils Absolute 0.1 0.0 - 0.5  K/uL   Basophils Relative 0 %   Basophils Absolute 0.0 0.0 - 0.1 K/uL   Immature Granulocytes 0 %   Abs Immature Granulocytes 0.04 0.00 - 0.07 K/uL    Comment: Performed at [facility], [address] [place], [place] [address]   DG CHEST PORT 1 VIEW  Result Date: 09/04/2021 CLINICAL DATA:  Pt having low blood pressure, bradycardia, tightness in chest and ribs all today - recent childbirth [phone] [id number] EXAM: PORTABLE CHEST 1 VIEW COMPARISON:  None Available. FINDINGS: Normal cardiac silhouette. Hazy density in lower lobes. No focal consolidation. No pleural fluid. No pneumothorax. IMPRESSION: Hazy density lower lobes could represent mild edema or atelectasis. Electronically Signed   By: Genevive Bi M.D.   On: 09/04/2021 15:58    Review Of Systems Constitutional: No fever, chills, weight loss or gain. Eyes: No vision change. No discharge or pain. Ears: No hearing loss, No tinnitus. Respiratory: No asthma, COPD, pneumonias. Positive shortness of breath. No hemoptysis. Cardiovascular: Positive chest pain, palpitation, leg edema. Gastrointestinal: No nausea, vomiting, diarrhea, constipation. No GI bleed. No hepatitis. Genitourinary: No dysuria, hematuria, kidney stone. No incontinance. Neurological: No headache, stroke, seizures.  Psychiatry: No psych facility admission for anxiety, depression, suicide. No detox. Skin: No rash. Musculoskeletal: No joint pain, fibromyalgia. No neck pain, back pain. Lymphadenopathy: No lymphadenopathy. Hematology: No anemia or easy bruising.   Blood pressure (!) 162/101, pulse (!) 103, temperature 98.5 F (36.9 C), temperature source Oral, resp. rate 18, SpO2 100 %, currently breastfeeding. There is no height or weight on file to calculate BMI. General appearance: alert, cooperative, appears stated age and no distress Head: Normocephalic, atraumatic. Eyes: Brown eyes, pink conjunctiva, corneas clear. PERRL, EOM's intact. Neck: No adenopathy, no  carotid bruit, no JVD, supple, symmetrical, trachea midline and thyroid not enlarged. Resp: Clearing to auscultation bilaterally. Cardio: Bradycardia to normal rate and rhythm, S1, S2 normal, III/VI systolic murmur, no click, rub or gallop GI: Soft, non-tender; bowel sounds normal; no organomegaly. Extremities: Trace bilateral lower leg edema, no cyanosis or clubbing. Skin: Warm and dry.  Neurologic: Alert and oriented X 3, normal strength. Normal coordination.  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 X one -done earlier, HR improves from 40's to 60's. Check BNP, Troponin I. Cardiac interventions as needed. IV furosemide one dose. Avoid ibuprofen. No heavy duty lifting, pulling and pushing for several months. Discussed salt and fluid restriction. Awaiting CT chest to r/o PE, check for aorta size.. Repeat EKG : NSR.  Time spent: Review of old records, Lab, x-rays, EKG, other cardiac tests, examination, discussion with patient over 70 minutes.   Ricki Rodriguez, MD  09/04/2021, 4:20 PM

## 2021-09-04 NOTE — MAU Provider Note (Signed)
History     CSN: [id number]  Arrival date and time: 09/04/21 1200   Event Date/Time   First Provider Initiated Contact with Patient 09/04/21 1309      Chief Complaint  Patient presents with   Bradycardia   HPI Rachael Hoover is a 36 y.o. G1P1001 postpartum from a vaginal delivery on 5/19 who presents for evaluation of a low heart rate. Patient reports she started feeling "crummy" on 5/22. Last night, her apple watch alerted that her heart rate was in the 30s. She reports when she lays down, she feels like her chest is tight and caving in on her. She also feels short of breath when she lays down. She also reports pain in her lower chest and ribs "like a cracked rib." Patient rates the pain as a 4/10 and has not tried anything for the pain.   She was induced for gestational hypertension. She reports she was normotensive on discharge and was not started on any medication.   OB History     Gravida  1   Para  1   Term  1   Preterm      AB      Living  1      SAB      IAB      Ectopic      Multiple  0   Live Births  1        Obstetric Comments  2023 ind for elevated BP         Past Medical History:  Diagnosis Date   Anxiety 2007   Infertility, female    Newborn product of in vitro fertilization (IVF) pregnancy     Past Surgical History:  Procedure Laterality Date   HERNIA REPAIR     when a small child - inguinal ?   ivf     TONSILLECTOMY      Family History  Problem Relation Age of Onset   Anxiety disorder Mother    Hypertension Father     Social History   Tobacco Use   Smoking status: Never   Smokeless tobacco: Never  Vaping Use   Vaping Use: Never used  Substance Use Topics   Alcohol use: Not Currently    Comment: few days a week   Drug use: No    Allergies:  Allergies  Allergen Reactions   Hydrocodone-Acetaminophen Nausea And Vomiting    Patient states she takes tylenol by itself with no reaction    Medications Prior to  Admission  Medication Sig Dispense Refill Last Dose   ergocalciferol (VITAMIN D2) 1.25 MG (50000 UT) capsule ergocalciferol (vitamin D2) 1,250 mcg (50,000 unit) capsule  Take 1 capsule every week by oral route. (Patient not taking: Reported on 08/19/2021)      Prenatal Vit-Fe Fumarate-FA (PRENATAL MULTIVITAMIN) TABS tablet Take 1 tablet by mouth daily at 12 noon.       Review of Systems  Constitutional: Negative.  Negative for fatigue and fever.  HENT: Negative.    Respiratory:  Positive for shortness of breath.   Cardiovascular:  Positive for chest pain.  Gastrointestinal: Negative.  Negative for abdominal pain, constipation, diarrhea, nausea and vomiting.  Genitourinary: Negative.  Negative for dysuria.  Neurological: Negative.  Negative for dizziness and headaches.  Physical Exam   Blood pressure (!) 143/81, pulse (!) 45, temperature 98.5 F (36.9 C), temperature source Oral, resp. rate 18, SpO2 100 %, currently breastfeeding.  Physical Exam Vitals and nursing note reviewed.  Constitutional:  General: She is not in acute distress.    Appearance: She is well-developed.  HENT:     Head: Normocephalic.  Eyes:     Pupils: Pupils are equal, round, and reactive to light.  Cardiovascular:     Rate and Rhythm: Regular rhythm. Bradycardia present.     Heart sounds: Normal heart sounds.  Pulmonary:     Effort: Pulmonary effort is normal. No respiratory distress.     Breath sounds: Normal breath sounds.  Abdominal:     General: Bowel sounds are normal. There is no distension.     Palpations: Abdomen is soft.     Tenderness: There is no abdominal tenderness.  Skin:    General: Skin is warm and dry.  Neurological:     Mental Status: She is alert and oriented to person, place, and time.  Psychiatric:        Mood and Affect: Mood normal.        Behavior: Behavior normal.        Thought Content: Thought content normal.        Judgment: Judgment normal.    MAU Course   Procedures Results for orders placed or performed during the hospital encounter of 09/04/21 (from the past 24 hour(s))  Comprehensive metabolic panel     Status: Abnormal   Collection Time: 09/04/21  1:08 PM  Result Value Ref Range   Sodium 140 135 - 145 mmol/L   Potassium 4.0 3.5 - 5.1 mmol/L   Chloride 110 98 - 111 mmol/L   CO2 26 22 - 32 mmol/L   Glucose, Bld 77 70 - 99 mg/dL   BUN 12 6 - 20 mg/dL   Creatinine, Ser 8.12 0.44 - 1.00 mg/dL   Calcium 8.4 (L) 8.9 - 10.3 mg/dL   Total Protein 5.4 (L) 6.5 - 8.1 g/dL   Albumin 2.6 (L) 3.5 - 5.0 g/dL   AST 31 15 - 41 U/L   ALT 35 0 - 44 U/L   Alkaline Phosphatase 99 38 - 126 U/L   Total Bilirubin 0.6 0.3 - 1.2 mg/dL   GFR, Estimated >75 >17 mL/min   Anion gap 4 (L) 5 - 15  CBC with Differential/Platelet     Status: Abnormal   Collection Time: 09/04/21  1:08 PM  Result Value Ref Range   WBC 9.3 4.0 - 10.5 K/uL   RBC 3.77 (L) 3.87 - 5.11 MIL/uL   Hemoglobin 11.3 (L) 12.0 - 15.0 g/dL   HCT 00.1 (L) 74.9 - 44.9 %   MCV 91.5 80.0 - 100.0 fL   MCH 30.0 26.0 - 34.0 pg   MCHC 32.8 30.0 - 36.0 g/dL   RDW 67.5 91.6 - 38.4 %   Platelets 264 150 - 400 K/uL   nRBC 0.0 0.0 - 0.2 %   Neutrophils Relative % 75 %   Neutro Abs 6.9 1.7 - 7.7 K/uL   Lymphocytes Relative 18 %   Lymphs Abs 1.6 0.7 - 4.0 K/uL   Monocytes Relative 6 %   Monocytes Absolute 0.5 0.1 - 1.0 K/uL   Eosinophils Relative 1 %   Eosinophils Absolute 0.1 0.0 - 0.5 K/uL   Basophils Relative 0 %   Basophils Absolute 0.0 0.0 - 0.1 K/uL   Immature Granulocytes 0 %   Abs Immature Granulocytes 0.04 0.00 - 0.07 K/uL    DG CHEST PORT 1 VIEW  Result Date: 09/04/2021 CLINICAL DATA:  Pt having low blood pressure, bradycardia, tightness in chest and ribs all today -  recent childbirth [id number] EXAM: PORTABLE CHEST 1 VIEW COMPARISON:  None Available. FINDINGS: Normal cardiac silhouette. Hazy density in lower lobes. No focal consolidation. No pleural fluid. No pneumothorax.  IMPRESSION: Hazy density lower lobes could represent mild edema or atelectasis. Electronically Signed   By: Genevive Bi M.D.   On: 09/04/2021 15:58     MDM CBC with Diff CMP ED EKG  Consulted with Dr. Alysia Penna- recommends consultation with Cardiology to discuss treatment for persistent bradycardia  Imaging to rule out PE and pulmonary edema ordered while cardiology paged Chest Xray CT Angio Chest   1412: Dr. Algie Coffer with cardiology notified of patient arrival and presentation. Order placed for echocardiogram stat and MD will come to bedside. Recommended IV atropine but CNM will wait for MD to be at bedside first. Rapid Response OB called to bedside to assist with administration.  1430: Echo at bedside  1445: Dr. Algie Coffer at bedside. Recommends administration of atropine at this time with q5 minute vital signs.   1503: atropine administered. Bradycardia resolved but now severe range blood pressures. Dr. Algie Coffer recommended 10mg  hydralazine IV now and again in 30 minutes if still severe range. Patient now developed diffuse red rash around face and neck. Repeat echo within normal limits per MD. Will continue previously planned work up and monitor BP closely. MD recommends admission to [facility] for suspected postpartum cardiomyopathy.   1530: Chest xray at bedside  1545: BP still severe range. Second dose to 10mg  IV hydralazine given. Dr. [facility] at bedside and requests repeat EKG.   1605: second dose of hydralazine given  1634: Dr. notified of patient arrival in MAU and MD recommendation for  admission. Dr. Algie Coffer reports concern for possible atypical preeclampsia and need for magnesium. CNM discussed with Dr. Tenny Craw who states this will not fluid overload her and is a reasonable intervention. Dr. Tenny Craw updated.   Assessment and Plan   1. Bradycardia   2. Postpartum cardiomyopathy    -Admit [facility]SC  -Care turned over to MD  Algie Coffer CNM 09/04/2021, 1:26 PM

## 2021-09-04 NOTE — MAU Note (Signed)
Pt to CT when finished breast feeding.  Plan for pt to go to [facility] when finished, rm 116.

## 2021-09-04 NOTE — H&P (Signed)
Rachael Hoover is a 36 y.o. female presenting for Bradycardia  36 yo G1P1 PPD # 5 s/p SVD. Yesterday pt began feeling poorly. Noted pulse to be in 30 to 40s on watch. Slept on it overnight and continued to feel bad today. Pulse remained bradycardic. Pt presented to MAU. Pt seen by cardiology in MAU and given atropine for bradycardia. Pulse improved and BPs increased from mild range to severe range. Received Hydralazine 81m IV x 2. Echo performed showed mild LV dysfunction,moderate MR, TR and dilated IVC. Admission for monitoring recommended by cardiology for possible early onset cardiomyopathy  Pt did not have elevated BPs during hospital admission. Pt had met criteria for gestational hypertension as indication for admission.During admission she was essentially normotensive. BPs mildly elevated initially in MAU, became severe range after bradycardia corrected. OB History     Gravida  1   Para  1   Term  1   Preterm      AB      Living  1      SAB      IAB      Ectopic      Multiple  0   Live Births  1        Obstetric Comments  2023 ind for elevated BP        Past Medical History:  Diagnosis Date   Anxiety 2007   Hypertension 2023   gestational   Infertility, female    Newborn product of in vitro fertilization (IVF) pregnancy    Past Surgical History:  Procedure Laterality Date   HERNIA REPAIR     when a small child - inguinal ?   ivf     TONSILLECTOMY     Family History: family history includes Anxiety disorder in her mother; Hypertension in her father. Social History:  reports that she has never smoked. She has never used smokeless tobacco. She reports that she does not currently use alcohol. She reports that she does not use drugs.       Review of Systems History   Blood pressure (!) 102/56, pulse 71, temperature 98.1 F (36.7 C), temperature source Oral, resp. rate 18, SpO2 98 %, currently breastfeeding. Exam Physical Exam  Prenatal labs: ABO,  Rh: --/--/O NEG (05/18 03790 Antibody: POS (05/18 02409 Rubella: Immune (10/31 0000) RPR: NON REACTIVE (05/18 0808)  HBsAg: Negative (10/31 0000)  HIV: Non-reactive (10/31 0000)  GBS: Negative/-- (05/02 0000)   Results for orders placed or performed during the hospital encounter of 09/04/21 (from the past 24 hour(s))  Comprehensive metabolic panel     Status: Abnormal   Collection Time: 09/04/21  1:08 PM  Result Value Ref Range   Sodium 140 135 - 145 mmol/L   Potassium 4.0 3.5 - 5.1 mmol/L   Chloride 110 98 - 111 mmol/L   CO2 26 22 - 32 mmol/L   Glucose, Bld 77 70 - 99 mg/dL   BUN 12 6 - 20 mg/dL   Creatinine, Ser 0.87 0.44 - 1.00 mg/dL   Calcium 8.4 (L) 8.9 - 10.3 mg/dL   Total Protein 5.4 (L) 6.5 - 8.1 g/dL   Albumin 2.6 (L) 3.5 - 5.0 g/dL   AST 31 15 - 41 U/L   ALT 35 0 - 44 U/L   Alkaline Phosphatase 99 38 - 126 U/L   Total Bilirubin 0.6 0.3 - 1.2 mg/dL   GFR, Estimated >60 >60 mL/min   Anion gap 4 (L) 5 - 15  CBC  with Differential/Platelet     Status: Abnormal   Collection Time: 09/04/21  1:08 PM  Result Value Ref Range   WBC 9.3 4.0 - 10.5 K/uL   RBC 3.77 (L) 3.87 - 5.11 MIL/uL   Hemoglobin 11.3 (L) 12.0 - 15.0 g/dL   HCT 34.5 (L) 36.0 - 46.0 %   MCV 91.5 80.0 - 100.0 fL   MCH 30.0 26.0 - 34.0 pg   MCHC 32.8 30.0 - 36.0 g/dL   RDW 12.7 11.5 - 15.5 %   Platelets 264 150 - 400 K/uL   nRBC 0.0 0.0 - 0.2 %   Neutrophils Relative % 75 %   Neutro Abs 6.9 1.7 - 7.7 K/uL   Lymphocytes Relative 18 %   Lymphs Abs 1.6 0.7 - 4.0 K/uL   Monocytes Relative 6 %   Monocytes Absolute 0.5 0.1 - 1.0 K/uL   Eosinophils Relative 1 %   Eosinophils Absolute 0.1 0.0 - 0.5 K/uL   Basophils Relative 0 %   Basophils Absolute 0.0 0.0 - 0.1 K/uL   Immature Granulocytes 0 %   Abs Immature Granulocytes 0.04 0.00 - 0.07 K/uL  Troponin I (High Sensitivity)     Status: None   Collection Time: 09/04/21  1:08 PM  Result Value Ref Range   Troponin I (High Sensitivity) 7 <18 ng/L  Brain  natriuretic peptide     Status: Abnormal   Collection Time: 09/04/21  1:08 PM  Result Value Ref Range   B Natriuretic Peptide 395.7 (H) 0.0 - 100.0 pg/mL  TSH     Status: None   Collection Time: 09/04/21  8:09 PM  Result Value Ref Range   TSH 1.830 0.350 - 4.500 uIU/mL  T4, free     Status: None   Collection Time: 09/04/21  8:09 PM  Result Value Ref Range   Free T4 0.94 0.61 - 1.12 ng/dL  Troponin I (High Sensitivity)     Status: Abnormal   Collection Time: 09/04/21  8:09 PM  Result Value Ref Range   Troponin I (High Sensitivity) 21 (H) <18 ng/L   EKG:  CT Angio Chest PE W and/or Wo Contrast 09/04/2021  Narrative CLINICAL DATA:  Pulmonary embolism (PE) suspected, high prob  EXAM: CT ANGIOGRAPHY CHEST WITH CONTRAST  TECHNIQUE: Multidetector CT imaging of the chest was performed using the standard protocol during bolus administration of intravenous contrast. Multiplanar CT image reconstructions and MIPs were obtained to evaluate the vascular anatomy.  RADIATION DOSE REDUCTION: This exam was performed according to the departmental dose-optimization program which includes automated exposure control, adjustment of the mA and/or kV according to patient size and/or use of iterative reconstruction technique.  CONTRAST:  6m OMNIPAQUE IOHEXOL 350 MG/ML SOLN  COMPARISON:  None Available.  FINDINGS: Cardiovascular: Satisfactory opacification of the pulmonary arteries to the segmental level. No evidence of pulmonary embolism. Mild cardiomegaly with enlarged left ventricle, right ventricle, and right atrium.No pericardial disease. The thoracic aorta is unremarkable.  Mediastinum/Nodes: Mildly prominent mediastinal nodes, likely reactive. No hilar or axillary lymphadenopathy. The thyroid is unremarkable. Esophagus is unremarkable.  Lungs/Pleura: Small bilateral pleural effusions, right greater than left. There is mild interlobular septal thickening in the lung bases. No  pneumothorax. No focal airspace consolidation. No suspicious pulmonary nodules  Upper Abdomen: No acute abnormality.  Musculoskeletal: No chest wall abnormality. No acute or significant osseous findings.  Review of the MIP images confirms the above findings.  IMPRESSION: Mild cardiomegaly with interstitial edema and small bilateral pleural effusions, right greater  than left.  No evidence of pulmonary embolism.   Electronically Signed By: Maurine Simmering M.D. On: 09/04/2021 18:07   Assessment/Plan: 1) Admit 2) Given persistent mildly elevated BPs will treat as superimposed Pre-e and treat with magnesium sulfate 3) Telemetry   Vanessa Kick 09/04/2021, 11:16 PM

## 2021-09-05 LAB — CBC WITH DIFFERENTIAL/PLATELET
Abs Immature Granulocytes: 0.04 10*3/uL (ref 0.00–0.07)
Basophils Absolute: 0 10*3/uL (ref 0.0–0.1)
Basophils Relative: 0 %
Eosinophils Absolute: 0.2 10*3/uL (ref 0.0–0.5)
Eosinophils Relative: 2 %
HCT: 38.5 % (ref 36.0–46.0)
Hemoglobin: 13.3 g/dL (ref 12.0–15.0)
Immature Granulocytes: 1 %
Lymphocytes Relative: 20 %
Lymphs Abs: 1.8 10*3/uL (ref 0.7–4.0)
MCH: 30.6 pg (ref 26.0–34.0)
MCHC: 34.5 g/dL (ref 30.0–36.0)
MCV: 88.5 fL (ref 80.0–100.0)
Monocytes Absolute: 0.7 10*3/uL (ref 0.1–1.0)
Monocytes Relative: 8 %
Neutro Abs: 6.2 10*3/uL (ref 1.7–7.7)
Neutrophils Relative %: 69 %
Platelets: 337 10*3/uL (ref 150–400)
RBC: 4.35 MIL/uL (ref 3.87–5.11)
RDW: 12.7 % (ref 11.5–15.5)
WBC: 8.8 10*3/uL (ref 4.0–10.5)
nRBC: 0 % (ref 0.0–0.2)

## 2021-09-05 LAB — TROPONIN I (HIGH SENSITIVITY): Troponin I (High Sensitivity): 7 ng/L (ref <18)

## 2021-09-05 LAB — COMPREHENSIVE METABOLIC PANEL
ALT: 33 U/L (ref 0–44)
AST: 28 U/L (ref 15–41)
Albumin: 2.7 g/dL — ABNORMAL LOW (ref 3.5–5.0)
Alkaline Phosphatase: 106 U/L (ref 38–126)
Anion gap: 10 (ref 5–15)
BUN: 14 mg/dL (ref 6–20)
CO2: 24 mmol/L (ref 22–32)
Calcium: 7.4 mg/dL — ABNORMAL LOW (ref 8.9–10.3)
Chloride: 104 mmol/L (ref 98–111)
Creatinine, Ser: 0.99 mg/dL (ref 0.44–1.00)
GFR, Estimated: >60 mL/min (ref >60)
Glucose, Bld: 88 mg/dL (ref 70–99)
Potassium: 3.5 mmol/L (ref 3.5–5.1)
Sodium: 138 mmol/L (ref 135–145)
Total Bilirubin: 0.7 mg/dL (ref 0.3–1.2)
Total Protein: 5.7 g/dL — ABNORMAL LOW (ref 6.5–8.1)

## 2021-09-05 LAB — ECHOCARDIOGRAM COMPLETE
Area-P 1/2: 5.06 cm2
S' Lateral: 3.7 cm
Single Plane A4C EF: 54.9 %

## 2021-09-05 LAB — MAGNESIUM: Magnesium: 6.2 mg/dL (ref 1.7–2.4)

## 2021-09-05 MED ORDER — POTASSIUM CHLORIDE ER 10 MEQ PO TBCR
10.0000 meq | EXTENDED_RELEASE_TABLET | Freq: Every day | ORAL | Status: DC
  Administered 2021-09-05 – 2021-09-06 (×2): 10 meq via ORAL
  Filled 2021-09-05 (×3): qty 1

## 2021-09-05 MED ORDER — SOD CITRATE-CITRIC ACID 500-334 MG/5ML PO SOLN: 30.0000 mL | Freq: Once | ORAL | Status: DC

## 2021-09-05 MED ORDER — BUTALBITAL-APAP-CAFFEINE 50-325-40 MG PO TABS
2.0000 | ORAL_TABLET | ORAL | Status: DC | PRN
  Administered 2021-09-05 (×2): 2 via ORAL
  Filled 2021-09-05 (×2): qty 2

## 2021-09-05 MED ORDER — FUROSEMIDE 20 MG PO TABS
20.0000 mg | ORAL_TABLET | ORAL | Status: DC
  Administered 2021-09-06: 20 mg via ORAL
  Filled 2021-09-05: qty 1

## 2021-09-05 NOTE — Progress Notes (Signed)
Post Partum Day 6 Subjective: Pt reports feeling fatigued and with low energy likely due to MgSO4. She has a HA and breast engorgement. Baby is feeding well - plans to continue with breastfeeding. She denies CP, SOB or dizziness.  She admits to history of low HR and vagal episodes related to mild triggers eg diet in past  Objective: Blood pressure 115/75, pulse 63, temperature 98.2 F (36.8 C), temperature source Oral, resp. rate 18, SpO2 98 %, currently breastfeeding.  Physical Exam:  General: alert, cooperative, fatigued, and no distress Lochia: appropriate Uterine Fundus: firm Incision: n/a DVT Evaluation: Negative Homan's sign.  Recent Labs    09/04/21 1308 09/05/21 0519  HGB 11.3* 13.3  HCT 34.5* 38.5    Assessment/Plan: Pt is on PPD#6 s/p svd.  Pt seen by Cardiologist today - appreciate recommendations given - EKG sinus rhythm - CT - no PE - MgSo4 stopped ( Mg level 6.2)  - Pt started on K+ daily per cardiology  - Pt to receive lasix PO M-W-F per cardiology - BP stable at this time; treat w/ hydralazine 10mg  PO prn >120/80 - Pt to decrease salt intake by 25%  Routine care otherwise    LOS: 1 day   Dorethia Jeanmarie W Kamarah Bilotta 09/05/2021, 11:57 AM

## 2021-09-05 NOTE — Consult Note (Signed)
Ref: Emi Belfast, FNP   Subjective:  Good diuresis with one dose of furosemide IV.  Mild respiratory distress continues. Feels tired. Patient aware of mild LV systolic dysfunction, moderate MR and TR and early postpartum cardiomyopathy. Elevated magnesium level with IV magnesium. Monitor shows normal sinus rhythm.  Objective:  Vital Signs in the last 24 hours: Temp:  [97.7 F (36.5 C)-99 F (37.2 C)] 97.8 F (36.6 C) (05/25 0800) Pulse Rate:  [38-116] 71 (05/25 0800) Cardiac Rhythm: Normal sinus rhythm (05/24 2110) Resp:  [16-18] 18 (05/25 0800) BP: (102-185)/(56-123) 115/80 (05/25 0800) SpO2:  [91 %-100 %] 98 % (05/25 0800)  Physical Exam: BP Readings from Last 1 Encounters:  09/05/21 115/80     Wt Readings from Last 1 Encounters:  08/29/21 88.7 kg    Weight change:  There is no height or weight on file to calculate BMI. HEENT: [place]/AT, Eyes-Brown, Conjunctiva-Pink, Sclera-Non-icteric Neck: No JVD, No bruit, Trachea midline. Lungs:  Clearing, Bilateral. Cardiac:  Regular rhythm, normal S1 and S2, no S3. II/VI systolic murmur. Abdomen:  Soft, non-tender. BS present. Extremities:  No edema present. No cyanosis. No clubbing. CNS: AxOx3, Cranial nerves grossly intact, moves all 4 extremities.  Skin: Warm and dry.   Intake/Output from previous day: 05/24 0701 - 05/25 0700 In: 2574.5 [P.O.:1320; I.V.:1254.5] Out: 5900 [Urine:5900]    Lab Results: BMET    Component Value Date/Time   NA 138 09/05/2021 0519   NA 140 09/04/2021 1308   NA 139 08/29/2021 0808   K 3.5 09/05/2021 0519   K 4.0 09/04/2021 1308   K 3.5 08/29/2021 0808   CL 104 09/05/2021 0519   CL 110 09/04/2021 1308   CL 109 08/29/2021 0808   CO2 24 09/05/2021 0519   CO2 26 09/04/2021 1308   CO2 21 (L) 08/29/2021 0808   GLUCOSE 88 09/05/2021 0519   GLUCOSE 77 09/04/2021 1308   GLUCOSE 85 08/29/2021 0808   BUN 14 09/05/2021 0519   BUN 12 09/04/2021 1308   BUN 11 08/29/2021 0808   CREATININE  0.99 09/05/2021 0519   CREATININE 0.87 09/04/2021 1308   CREATININE 0.95 08/29/2021 0808   CALCIUM 7.4 (L) 09/05/2021 0519   CALCIUM 8.4 (L) 09/04/2021 1308   CALCIUM 8.9 08/29/2021 0808   GFRNONAA >60 09/05/2021 0519   GFRNONAA >60 09/04/2021 1308   GFRNONAA >60 08/29/2021 0808   GFRAA >60 03/10/2016 0823   CBC    Component Value Date/Time   WBC 8.8 09/05/2021 0519   RBC 4.35 09/05/2021 0519   HGB 13.3 09/05/2021 0519   HCT 38.5 09/05/2021 0519   PLT 337 09/05/2021 0519   MCV 88.5 09/05/2021 0519   MCH 30.6 09/05/2021 0519   MCHC 34.5 09/05/2021 0519   RDW 12.7 09/05/2021 0519   LYMPHSABS 1.8 09/05/2021 0519   MONOABS 0.7 09/05/2021 0519   EOSABS 0.2 09/05/2021 0519   BASOSABS 0.0 09/05/2021 0519   HEPATIC Function Panel Recent Labs    08/29/21 0808 09/04/21 1308 09/05/21 0519  PROT 6.3* 5.4* 5.7*   HEMOGLOBIN A1C No components found for: HGA1C,  MPG CARDIAC ENZYMES No results found for: CKTOTAL, CKMB, CKMBINDEX, TROPONINI BNP No results for input(s): PROBNP in the last 8760 hours. TSH Recent Labs    09/04/21 2009  TSH 1.830   CHOLESTEROL No results for input(s): CHOL in the last 8760 hours.  Scheduled Meds:  prenatal multivitamin  1 tablet Oral Q1200   sodium chloride flush  3 mL Intravenous Q12H  Continuous Infusions:  sodium chloride     lactated ringers 50 mL/hr at 09/05/21 0543   magnesium sulfate 2 g/hr (09/04/21 1831)   PRN Meds:.sodium chloride, acetaminophen, butalbital-acetaminophen-caffeine, labetalol **AND** labetalol **AND** labetalol **AND** hydrALAZINE **AND** Measure blood pressure, ibuprofen, oxyCODONE, senna-docusate, sodium chloride flush  Assessment/Plan:   Early postpartum cardiomyopathy Acute combined systolic nd diastolic left heart failure with preserved LV systolic function Sinus bradycardia, resoled Moderate MR and TR S/P spontaneous vaginal delivery  Plan: May discontinue magnesium drip. Add potassium daily. PO  lasix M-W-F. Add hydralazine 10 mg. PO bid if BP over 120/80. Patient understood to decrease salt and fluid intake by 25 %.   LOS: 1 day   Time spent including chart review, lab review, examination, discussion with patient/Husband/Nurse : 30 min   Orpah Cobb  MD  09/05/2021, 10:52 AM

## 2021-09-06 ENCOUNTER — Other Ambulatory Visit: Payer: Self-pay

## 2021-09-06 LAB — BASIC METABOLIC PANEL
Anion gap: 11 (ref 5–15)
BUN: 15 mg/dL (ref 6–20)
CO2: 21 mmol/L — ABNORMAL LOW (ref 22–32)
Calcium: 6.7 mg/dL — ABNORMAL LOW (ref 8.9–10.3)
Chloride: 107 mmol/L (ref 98–111)
Creatinine, Ser: 1.01 mg/dL — ABNORMAL HIGH (ref 0.44–1.00)
GFR, Estimated: >60 mL/min (ref >60)
Glucose, Bld: 80 mg/dL (ref 70–99)
Potassium: 4 mmol/L (ref 3.5–5.1)
Sodium: 139 mmol/L (ref 135–145)

## 2021-09-06 LAB — LIPID PANEL
Cholesterol: 245 mg/dL — ABNORMAL HIGH (ref 0–200)
HDL: 87 mg/dL (ref >40)
LDL Cholesterol: 137 mg/dL — ABNORMAL HIGH (ref 0–99)
Total CHOL/HDL Ratio: 2.8 RATIO
Triglycerides: 107 mg/dL (ref <150)
VLDL: 21 mg/dL (ref 0–40)

## 2021-09-06 MED ORDER — HYDRALAZINE HCL 10 MG PO TABS: 10.0000 mg | ORAL_TABLET | Freq: Two times a day (BID) | ORAL | 0 refills | Status: DC

## 2021-09-06 MED ORDER — POTASSIUM CHLORIDE ER 10 MEQ PO TBCR: 10.0000 meq | EXTENDED_RELEASE_TABLET | Freq: Every day | ORAL | 1 refills | Status: DC

## 2021-09-06 MED ORDER — HYDRALAZINE HCL 10 MG PO TABS
5.0000 mg | ORAL_TABLET | Freq: Four times a day (QID) | ORAL | Status: DC
  Administered 2021-09-06 (×2): 5 mg via ORAL
  Filled 2021-09-06 (×2): qty 1

## 2021-09-06 MED ORDER — FUROSEMIDE 20 MG PO TABS: 20.0000 mg | ORAL_TABLET | ORAL | 3 refills | Status: DC

## 2021-09-06 MED ORDER — HYDRALAZINE HCL 10 MG PO TABS: 5.0000 mg | ORAL_TABLET | Freq: Four times a day (QID) | ORAL | 0 refills | Status: DC

## 2021-09-06 NOTE — Consult Note (Signed)
Ref: Emi Belfast, FNP   Subjective:  Feeling better. Anxious to go home. Discussed 1500 ml. Fluid restriction, Salt restriction to 2 gm, sodium. Daily weight and leg edema to assist on diuretic use. Ordinary activity as tolerated. No lifting, pulling or pushing over 10 pounds. Repeat echocardiogram in 3 months.  Avoid another pregnancy till at least heart function and functional capacity improves and may avoid all together. Add ACEi and Spironolactone in future if needed.  Objective:  Vital Signs in the last 24 hours: Temp:  [97.8 F (36.6 C)-98.9 F (37.2 C)] 98.9 F (37.2 C) (05/26 1231) Pulse Rate:  [49-82] 63 (05/26 1231) Cardiac Rhythm: Sinus bradycardia (05/26 0707) Resp:  [16-18] 16 (05/26 0817) BP: (108-118)/(65-83) 116/82 (05/26 1231) SpO2:  [98 %-100 %] 99 % (05/26 1231)  Physical Exam: BP Readings from Last 1 Encounters:  09/06/21 116/82     Wt Readings from Last 1 Encounters:  08/29/21 88.7 kg    Weight change:  There is no height or weight on file to calculate BMI. HEENT: [place]/AT, Eyes-Brown, Conjunctiva-Pink, Sclera-Non-icteric Neck: No JVD, No bruit, Trachea midline. Lungs:  Clear, Bilateral. Cardiac:  Regular rhythm, normal S1 and S2, no S3. II/VI systolic murmur. Abdomen:  Soft, non-tender. BS present. Extremities:  No edema present. No cyanosis. No clubbing. CNS: AxOx3, Cranial nerves grossly intact, moves all 4 extremities.  Skin: Warm and dry.   Intake/Output from previous day: 05/25 0701 - 05/26 0700 In: 1589.3 [P.O.:1120; I.V.:469.3] Out: 2900 [Urine:2900]    Lab Results: BMET    Component Value Date/Time   NA 139 09/06/2021 0459   NA 138 09/05/2021 0519   NA 140 09/04/2021 1308   K 4.0 09/06/2021 0459   K 3.5 09/05/2021 0519   K 4.0 09/04/2021 1308   CL 107 09/06/2021 0459   CL 104 09/05/2021 0519   CL 110 09/04/2021 1308   CO2 21 (L) 09/06/2021 0459   CO2 24 09/05/2021 0519   CO2 26 09/04/2021 1308   GLUCOSE 80 09/06/2021  0459   GLUCOSE 88 09/05/2021 0519   GLUCOSE 77 09/04/2021 1308   BUN 15 09/06/2021 0459   BUN 14 09/05/2021 0519   BUN 12 09/04/2021 1308   CREATININE 1.01 (H) 09/06/2021 0459   CREATININE 0.99 09/05/2021 0519   CREATININE 0.87 09/04/2021 1308   CALCIUM 6.7 (L) 09/06/2021 0459   CALCIUM 7.4 (L) 09/05/2021 0519   CALCIUM 8.4 (L) 09/04/2021 1308   GFRNONAA >60 09/06/2021 0459   GFRNONAA >60 09/05/2021 0519   GFRNONAA >60 09/04/2021 1308   GFRAA >60 03/10/2016 0823   CBC    Component Value Date/Time   WBC 8.8 09/05/2021 0519   RBC 4.35 09/05/2021 0519   HGB 13.3 09/05/2021 0519   HCT 38.5 09/05/2021 0519   PLT 337 09/05/2021 0519   MCV 88.5 09/05/2021 0519   MCH 30.6 09/05/2021 0519   MCHC 34.5 09/05/2021 0519   RDW 12.7 09/05/2021 0519   LYMPHSABS 1.8 09/05/2021 0519   MONOABS 0.7 09/05/2021 0519   EOSABS 0.2 09/05/2021 0519   BASOSABS 0.0 09/05/2021 0519   HEPATIC Function Panel Recent Labs    08/29/21 0808 09/04/21 1308 09/05/21 0519  PROT 6.3* 5.4* 5.7*   HEMOGLOBIN A1C No components found for: HGA1C,  MPG CARDIAC ENZYMES No results found for: CKTOTAL, CKMB, CKMBINDEX, TROPONINI BNP No results for input(s): PROBNP in the last 8760 hours. TSH Recent Labs    09/04/21 2009  TSH 1.830   CHOLESTEROL Recent Labs  09/06/21 0459  CHOL 245*    Scheduled Meds:  furosemide  20 mg Oral Q M,W,F   hydrALAZINE  5 mg Oral QID   potassium chloride  10 mEq Oral Daily   prenatal multivitamin  1 tablet Oral Q1200   sodium chloride flush  3 mL Intravenous Q12H   Continuous Infusions:  sodium chloride     PRN Meds:.sodium chloride, acetaminophen, butalbital-acetaminophen-caffeine, [DISCONTINUED] labetalol **AND** [DISCONTINUED] labetalol **AND** [DISCONTINUED] labetalol **AND** hydrALAZINE **AND** Measure blood pressure, ibuprofen, oxyCODONE, senna-docusate, sodium chloride flush  Assessment/Plan:  Early postpartum cardiomyopathy Acute combined systolic and  diastolic left heart failure, compensated Sinus bradycardia, improved Moderate MR and TR S/P spontaneous vaginal delivery  Plan: Continue small dose hydralazine and intermittent furosemide as tolerated. Weight daily. Limit activity as discussed. Avoid future pregnancy till improved. F/U in 1 month. May discharge from cardiology stand point.    LOS: 2 days   Time spent including chart review, lab review, examination, discussion with patient/Husband/Nurse : 30 min   Orpah Cobb  MD  09/06/2021, 1:46 PM

## 2021-09-06 NOTE — H&P (Signed)
Rachael Hoover is a 36 y.o. female G1P0 at 77 5/7 weeks (EDD   09/07/21 by 6 week [place] and known DOC from IVF) presenting for IOL for gestational hypertension.   Prenatal care significant for:   1) Anxiety disorder    took meds prior to pregnancy (xanax and klonopin)--off now and doing ok              2) Primary female infertility  Stage 4 endometriosis--clinical diagnosis by [place] with endometriomas Lupron then embryo transfer              3) Placenta previa      Posterior previa on anatomy scan  RESOLVED MFM [place] 08/19/21 EFW 53%ile 6#13oz, vertex, LLP resolved              4) RhD negative            5) Advanced maternal age gravida  Embryo tested 46XX In vitro fertilization--elective single FET of PGT tested embryo on 12/20/2020  46XX   6) Possible borderline CHTN  baseline BP 130-140-80-90             Baby ASA q day                     OB History       Gravida  1   Para      Term      Preterm      AB      Living           SAB      IAB      Ectopic      Multiple      Live Births                    Past Medical History:  Diagnosis Date   Anxiety 2007   Infertility, female     Newborn product of in vitro fertilization (IVF) pregnancy           Past Surgical History:  Procedure Laterality Date   HERNIA REPAIR        when a small child - inguinal ?   ivf       TONSILLECTOMY        Family History: family history includes Anxiety disorder in her mother; Hypertension in her father. Social History:  reports that she has never smoked. She has never used smokeless tobacco. She reports that she does not currently use alcohol. She reports that she does not use drugs.        Maternal Diabetes: No Genetic Screening: Normal Maternal Ultrasounds/Referrals: Normal Fetal Ultrasounds or other Referrals:  None Maternal Substance Abuse:  No Significant Maternal Medications:  None Significant Maternal Lab Results:  Group B Strep negative Other Comments:  None    Review of Systems  Constitutional:  Negative for fever.  Gastrointestinal:  Negative for abdominal pain.  Genitourinary:  Negative for vaginal bleeding.  Neurological:  Negative for headaches.  Maternal Medical History:  Contractions: Frequency: irregular.   Perceived severity is mild.   Fetal activity: Perceived fetal activity is normal.   Prenatal complications: IVF, gestational HTN, AMA Prenatal Complications - Diabetes: none.   Last menstrual period 01/10/2021. Exam FHR category 1 Physical Exam  Normal mood CV RRR Lungs CTA bilaterally Abd gravid NT  Prenatal labs: ABO, Rh: O/Negative/-- (10/31 0000) Antibody: Negative (10/31 0000) Rubella: Immune (10/31 0000) RPR: Nonreactive (10/31 0000)  HBsAg: Negative (10/31 0000)  HIV: Non-reactive (10/31 0000)  GBS:   Negative One hour GCT 118 Hgb AA Carrier screen negative x 3   Assessment/Plan: Pt for ripening and IOL at term for gestational hypertension. Labs on arrival.  Rx BP as needed.             Oliver Pila 08/28/2021, 10:41 PM

## 2021-09-06 NOTE — Progress Notes (Signed)
Post Partum Day 7 Subjective: Pt reports feeling much improved off of the magnesium sulfate.  She denies CP, SOB or dizziness.  Objective: Blood pressure 114/83, pulse (!) 49, temperature 98.9 F (37.2 C), temperature source Oral, resp. rate 16, SpO2 100 %, currently breastfeeding.  Physical Exam:  General: alert, cooperative, fatigued, and no distress Lochia: appropriate Uterine Fundus: firm Incision: n/a DVT Evaluation: Negative Homan's sign.  Recent Labs    09/04/21 1308 09/05/21 0519  HGB 11.3* 13.3  HCT 34.5* 38.5    Assessment/Plan: Pt is on PPD#7 s/p svd.  From an obstetrics standpoint is clear for discharge  Will await follow up recommendations from cardiology today-- appreciate management recommendations - Pt started on K+ daily  - Pt to receive lasix PO M-W-F  - BP stable at this time; treat w/ hydralazine 10mg  PO prn >120/80 - Pt to decrease salt intake by 25%  Routine care otherwise    LOS: 2 days   [place] 09/06/2021, 8:59 AM

## 2021-09-06 NOTE — Discharge Summary (Signed)
Physician Discharge Summary  Patient ID: Rachael Hoover MRN: [id number] DOB/AGE: September 23, 1985 36 y.o.  Admit date: 09/04/2021 Discharge date: 09/06/2021  Admission Diagnoses:  Discharge Diagnoses:  Principal Problem:   Bradycardia   Discharged Condition: good  Hospital Course: 36 yo G1P1 PPD # 5 s/p SVD. Yesterday pt began feeling poorly. Noted pulse to be in 30 to 40s on watch. Slept on it overnight and continued to feel bad today. Pulse remained bradycardic. Pt presented to [facility]. Pt seen by cardiology in [facility] and given atropine for bradycardia. Pulse improved and BPs increased from mild range to severe range. Received Hydralazine 34m IV x 2. Echo performed showed mild LV dysfunction,moderate MR, TR and dilated IVC. Admission for monitoring recommended by cardiology for possible early onset cardiomyopathy   Pt did not have elevated BPs during hospital admission. Pt had met criteria for gestational hypertension as indication for admission.During admission she was essentially normotensive. BPs mildly elevated initially in MAU, became severe range after bradycardia corrected.  She received a dose of atropine in MAU.  Was admitted and received 12 hours of magnesium sulfate.  Cardiology was consulted and followed during admission.  Ultimately HR normalized and the 50s and BP normalized off of magnesium sulfate.  Cardiology transitioned to lasix 3x/week and hydralazine 578mPO QID.  She was discharged home with plan for f/u with cardiology in 1 month and repeat echocardiogram in 3 months  Consults: cardiology  Significant Diagnostic Studies: CT, ECHO, EKG  Treatments: as above  Discharge Exam: Blood pressure 116/82, pulse 63, temperature 98.9 F (37.2 C), temperature source Oral, resp. rate 16, SpO2 99 %, currently breastfeeding. General appearance: alert, cooperative, and appears stated age Extremities: extremities normal, atraumatic, no cyanosis or edema  Disposition: Discharge disposition:  01-Home or Self Care       Discharge Instructions     Call MD for:  difficulty breathing, headache or visual disturbances   Complete by: As directed    Call MD for:  extreme fatigue   Complete by: As directed    Call MD for:  hives   Complete by: As directed    Call MD for:  persistant dizziness or light-headedness   Complete by: As directed    Call MD for:  persistant nausea and vomiting   Complete by: As directed    Call MD for:  redness, tenderness, or signs of infection (pain, swelling, redness, odor or green/yellow discharge around incision site)   Complete by: As directed    Call MD for:  severe uncontrolled pain   Complete by: As directed    Call MD for:  temperature >100.4   Complete by: As directed    Diet - low sodium heart healthy   Complete by: As directed    Sexual activity   Complete by: As directed    Nothing in vagina x 6 weeks (no tampons, no sex)      Allergies as of 09/06/2021       Reactions   Hydrocodone-acetaminophen Nausea And Vomiting   Patient states she takes tylenol by itself with no reaction "Violent"vomiting        Medication List     STOP taking these medications    ibuprofen 200 MG tablet Commonly known as: ADVIL       TAKE these medications    ergocalciferol 1.25 MG (50000 UT) capsule Commonly known as: VITAMIN D2 ergocalciferol (vitamin D2) 1,250 mcg (50,000 unit) capsule  Take 1 capsule every week by oral route.  furosemide 20 MG tablet Commonly known as: LASIX Take 1 tablet (20 mg total) by mouth every Monday, Wednesday, and Friday. Start taking on: Sep 09, 2021   hydrALAZINE 10 MG tablet Commonly known as: APRESOLINE Take 1 tablet (10 mg total) by mouth 2 (two) times daily. As needed for BP > 120/80   potassium chloride 10 MEQ tablet Commonly known as: KLOR-CON Take 1 tablet (10 mEq total) by mouth daily. Start taking on: Sep 07, 2021   prenatal multivitamin Tabs tablet Take 1 tablet by mouth daily at 12  noon.        Follow-up Information     Dixie Dials, MD Follow up in 1 month(s).   Specialty: Cardiology Contact information: [place] [address] [place], [facility] Follow up in 1 week(s).   Why: BP check in 1 week Contact information: [address]  [address] [place] [address] [phone]                 Signed: [place] 09/06/2021, 2:32 PM

## 2021-09-06 NOTE — Progress Notes (Signed)
Pt discharged after discharge instructions given. Pt discharged in stable condition with all belongings.

## 2021-09-10 ENCOUNTER — Telehealth (HOSPITAL_COMMUNITY): Payer: Self-pay | Admitting: *Deleted

## 2021-09-10 NOTE — Telephone Encounter (Signed)
Mom reports feeling okay. No concerns healing from delivery at this time, but concerned about her heart. Has an appt with cardiology in a month. EPDS=3 [facility] score=2) Mom reports baby is doing well. Feeding, peeing, and pooping without difficulty. Safe sleep reviewed. Mom reports no concerns about baby at present.  Duffy Rhody, RN 09-10-2021 at 1:39pm

## 2021-09-24 NOTE — Progress Notes (Incomplete)
CARDIOLOGY CONSULT NOTE       Patient ID: Rachael Hoover MRN: [id number] DOB/AGE: 12-21-1985 36 y.o.  Admit date: (Not on file) Referring Physician: Waynard Hoover Primary Physician: Rachael Belfast, FNP Primary Cardiologist: New Reason for Consultation: Bradycardia/Post partum DCM  Active Problems:   * No active hospital problems. *   HPI:  36 y.o. referred by Dr Rachael Hoover for postpartum DCM and bradycardia. She is the sister of my old nurse Rachael Hoover. 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 Anxiety 2007  . Hypertension 2023   gestational  . Infertility, female   . Newborn product of in vitro fertilization (IVF) pregnancy     Family History  Problem Relation Age of Onset  . Anxiety disorder Mother   . Hypertension Father     Social History   Socioeconomic History  . Marital status: Married    Spouse name: Not on file  . Number of children: Not on file  . Years of education: Not on file  . Highest education level: Not on file  Occupational History  . Not on file  Tobacco Use  . Smoking status: Never  . Smokeless tobacco: Never  Vaping Use  . Vaping Use: Never used   Substance and Sexual Activity  . Alcohol use: Not Currently    Comment: few days a week  . Drug use: No  . Sexual activity: Not Currently  Other Topics Concern  . Not on file  Social History Narrative  . Not on file   Social Determinants of Health   Financial Resource Strain: Not on file  Food Insecurity: Not on file  Transportation Needs: Not on file  Physical Activity: Not on file  Stress: Not on file  Social Connections: Not on file  Intimate Partner Violence: Not on file    Past Surgical History:  Procedure Laterality Date  . HERNIA REPAIR     when a small child - inguinal ?  . ivf    . TONSILLECTOMY        Current Outpatient Medications:  .  ergocalciferol (VITAMIN D2) 1.25 MG (50000 UT) capsule, ergocalciferol (vitamin D2) 1,250 mcg (50,000 unit) capsule  Take 1 capsule every week by oral route. (Patient not taking: Reported on 08/19/2021), Disp: , Rfl:  .  furosemide (LASIX) 20 MG tablet, Take 1 tablet (20 mg total) by mouth every Monday, Wednesday, and Friday., Disp: 30 tablet, Rfl: 3 .  hydrALAZINE (APRESOLINE) 10 MG tablet, Take 0.5 tablets (5 mg total) by mouth 4 (four) times daily., Disp: 120 tablet, Rfl: 0 .  potassium chloride (  KLOR-CON) 10 MEQ tablet, Take 1 tablet (10 mEq total) by mouth daily., Disp: 30 tablet, Rfl: 1 .  Prenatal Vit-Fe Fumarate-FA (PRENATAL MULTIVITAMIN) TABS tablet, Take 1 tablet by mouth daily at 12 noon., Disp: , Rfl:     Physical Exam: currently breastfeeding.    Affect appropriate Healthy:  appears stated age HEENT: normal Neck supple with no adenopathy JVP normal no bruits no thyromegaly Lungs clear with no wheezing and good diaphragmatic motion Heart:  S1/S2 no murmur, no rub, gallop or click PMI normal Abdomen: benighn, BS positve, no tenderness, no AAA no bruit.  No HSM or HJR Distal pulses intact with no bruits No edema Neuro non-focal Skin warm and dry No muscular weakness   Labs:   Lab Results  Component Value  Date   WBC 8.8 09/05/2021   HGB 13.3 09/05/2021   HCT 38.5 09/05/2021   MCV 88.5 09/05/2021   PLT 337 09/05/2021   No results for input(s): "NA", "K", "CL", "CO2", "BUN", "CREATININE", "CALCIUM", "PROT", "BILITOT", "ALKPHOS", "ALT", "AST", "GLUCOSE" in the last 168 hours.  Invalid input(s): "LABALBU" No results found for: "CKTOTAL", "CKMB", "CKMBINDEX", "TROPONINI"  Lab Results  Component Value Date   CHOL 245 (H) 09/06/2021   CHOL 205 (H) 08/19/2019   Lab Results  Component Value Date   HDL 87 09/06/2021   HDL 81.20 08/19/2019   Lab Results  Component Value Date   LDLCALC 137 (H) 09/06/2021   LDLCALC 113 (H) 08/19/2019   Lab Results  Component Value Date   TRIG 107 09/06/2021   TRIG 56.0 08/19/2019   Lab Results  Component Value Date   CHOLHDL 2.8 09/06/2021   CHOLHDL 3 08/19/2019   No results found for: "LDLDIRECT"    Radiology: ECHOCARDIOGRAM COMPLETE  Result Date: 09/05/2021    ECHOCARDIOGRAM REPORT   Patient Name:   Rachael Hoover Date of Exam: 09/04/2021 Medical Rec #:  [id number]      Height:       68.0 in Accession #:    [id number]     Weight:       195.5 lb Date of Birth:  16-Oct-1985      BSA:          2.024 m Patient Age:    36 years       BP:           136/92 mmHg Patient Gender: F              HR:           37 bpm. Exam Location:  Inpatient Procedure: 2D Echo, Color Doppler and Cardiac Doppler Indications:     R00.1 Bradycardia, unspecified  History:         Patient has no prior history of Echocardiogram examinations. 5                  days post partum.  Sonographer:     Rachael Hoover Senior RDCS Referring Phys:  [id number] Rachael Hoover Diagnosing Phys: Rachael Cobb MD  Sonographer Comments: 0.5mg  of atropine given between pictures 84 and 85 IMPRESSIONS  1. Left ventricular ejection fraction, by estimation, is 50 to 55%. The left ventricle has low normal function. The left ventricle demonstrates regional wall motion abnormalities (see scoring diagram/findings for  description). Left ventricular diastolic  parameters were normal. There is mild hypokinesis of the left ventricular, basal-mid septal wall.  2. Right ventricular systolic function is normal. The right ventricular size is normal. There is mildly  elevated pulmonary artery systolic pressure.  3. Left atrial size was mildly dilated.  4. Right atrial size was mildly dilated.  5. The mitral valve is myxomatous. Moderate mitral valve regurgitation.  6. Tricuspid valve regurgitation is moderate.  7. The aortic valve is tricuspid. Aortic valve regurgitation is not visualized.  8. The inferior vena cava is dilated in size with <50% respiratory variability, suggesting right atrial pressure of 15 mmHg. Conclusion(s)/Recommendation(s): Findings consistent with early postpartum cardiomyopathy. FINDINGS  Left Ventricle: Left ventricular ejection fraction, by estimation, is 50 to 55%. The left ventricle has low normal function. The left ventricle demonstrates regional wall motion abnormalities. Mild hypokinesis of the left ventricular, basal-mid septal wall. The left ventricular internal cavity size was normal in size. There is borderline asymmetric left ventricular hypertrophy of the inferior segment. Left ventricular diastolic parameters were normal. Right Ventricle: The right ventricular size is normal. No increase in right ventricular wall thickness. Right ventricular systolic function is normal. There is mildly elevated pulmonary artery systolic pressure. The tricuspid regurgitant velocity is 2.32  m/s, and with an assumed right atrial pressure of 15 mmHg, the estimated right ventricular systolic pressure is 36.5 mmHg. Left Atrium: Left atrial size was mildly dilated. Right Atrium: Right atrial size was mildly dilated. Pericardium: There is no evidence of pericardial effusion. Mitral Valve: The mitral valve is myxomatous. Moderate mitral valve regurgitation. Tricuspid Valve: The tricuspid valve is normal in structure.  Tricuspid valve regurgitation is moderate. Aortic Valve: The aortic valve is tricuspid. Aortic valve regurgitation is not visualized. Pulmonic Valve: The pulmonic valve was normal in structure. Pulmonic valve regurgitation is not visualized. Aorta: The aortic root is normal in size and structure. Venous: The inferior vena cava is dilated in size with less than 50% respiratory variability, suggesting right atrial pressure of 15 mmHg. IAS/Shunts: The atrial septum is grossly normal.  LEFT VENTRICLE PLAX 2D LVIDd:         5.10 cm      Diastology LVIDs:         3.70 cm      LV e' medial:    11.50 cm/s LV PW:         1.20 cm      LV E/e' medial:  9.6 LV IVS:        0.70 cm      LV e' lateral:   16.30 cm/s LVOT diam:     1.90 cm      LV E/e' lateral: 6.7 LV SV:         67 LV SV Index:   33 LVOT Area:     2.84 cm  LV Volumes (MOD) LV vol d, MOD A4C: 113.0 ml LV vol s, MOD A4C: 51.0 ml LV SV MOD A4C:     113.0 ml RIGHT VENTRICLE RV S prime:     13.70 cm/s TAPSE (M-mode): 2.6 cm LEFT ATRIUM             Index        RIGHT ATRIUM           Index LA diam:        3.30 cm 1.63 cm/m   RA Area:     10.00 cm LA Vol (A2C):   46.7 ml 23.07 ml/m  RA Volume:   18.10 ml  8.94 ml/m LA Vol (A4C):   38.5 ml 19.02 ml/m LA Biplane Vol: 45.7 ml 22.58 ml/m  AORTIC VALVE LVOT Vmax:   92.20 cm/s LVOT Vmean:  60.000  cm/s LVOT VTI:    0.236 m  AORTA Ao Root diam: 3.00 cm Ao Asc diam:  3.20 cm MITRAL VALVE                TRICUSPID VALVE MV Area (PHT): 5.06 cm     TR Peak grad:   21.5 mmHg MV Decel Time: 150 msec     TR Vmax:        232.00 cm/s MV E velocity: 110.00 cm/s                             SHUNTS                             Systemic VTI:  0.24 m                             Systemic Diam: 1.90 cm Rachael Cobb MD Electronically signed by Rachael Cobb MD Signature Date/Time: 09/05/2021/9:11:38 AM    Final    CT Angio Chest PE W and/or Wo Contrast  Result Date: 09/04/2021 CLINICAL DATA:  Pulmonary embolism (PE) suspected, high prob  EXAM: CT ANGIOGRAPHY CHEST WITH CONTRAST TECHNIQUE: Multidetector CT imaging of the chest was performed using the standard protocol during bolus administration of intravenous contrast. Multiplanar CT image reconstructions and MIPs were obtained to evaluate the vascular anatomy. RADIATION DOSE REDUCTION: This exam was performed according to the departmental dose-optimization program which includes automated exposure control, adjustment of the mA and/or kV according to patient size and/or use of iterative reconstruction technique. CONTRAST:  80mL OMNIPAQUE IOHEXOL 350 MG/ML SOLN COMPARISON:  None Available. FINDINGS: Cardiovascular: Satisfactory opacification of the pulmonary arteries to the segmental level. No evidence of pulmonary embolism. Mild cardiomegaly with enlarged left ventricle, right ventricle, and right atrium.No pericardial disease. The thoracic aorta is unremarkable. Mediastinum/Nodes: Mildly prominent mediastinal nodes, likely reactive. No hilar or axillary lymphadenopathy. The thyroid is unremarkable. Esophagus is unremarkable. Lungs/Pleura: Small bilateral pleural effusions, right greater than left. There is mild interlobular septal thickening in the lung bases. No pneumothorax. No focal airspace consolidation. No suspicious pulmonary nodules Upper Abdomen: No acute abnormality. Musculoskeletal: No chest wall abnormality. No acute or significant osseous findings. Review of the MIP images confirms the above findings. IMPRESSION: Mild cardiomegaly with interstitial edema and small bilateral pleural effusions, right greater than left. No evidence of pulmonary embolism. Electronically Signed   By: Caprice Renshaw M.D.   On: 09/04/2021 18:07   DG CHEST PORT 1 VIEW  Result Date: 09/04/2021 CLINICAL DATA:  Pt having low blood pressure, bradycardia, tightness in chest and ribs all today - recent childbirth [phone] EXAM: PORTABLE CHEST 1 VIEW COMPARISON:  None Available. FINDINGS: Normal cardiac  silhouette. Hazy density in lower lobes. No focal consolidation. No pleural fluid. No pneumothorax. IMPRESSION: Hazy density lower lobes could represent mild edema or atelectasis. Electronically Signed   By: Genevive Bi M.D.   On: 09/04/2021 15:58    EKG: See HPI   ASSESSMENT AND PLAN:    DCM:  EF 50-55% with clinical post partum presentation CHF with elevated BNP dyspnea and CXR/CT showing edema Improved with lasix Pre term HTN but no eclampsia or protein in urine BP improved with lasix and hydralazine *** Bradycardia:  no AV block normal ECG improved  MR:  ? Antecedent MVP with myxomatous valve Moderate MR in setting  of pregnancy and volume overload ***   ***  F/U in 6 months  Signed: Charlton Haws 09/24/2021, 5:24 PM

## 2021-10-07 ENCOUNTER — Encounter: Payer: Self-pay | Admitting: Cardiovascular Disease

## 2021-10-07 ENCOUNTER — Ambulatory Visit (INDEPENDENT_AMBULATORY_CARE_PROVIDER_SITE_OTHER): Payer: 59 | Admitting: Cardiovascular Disease

## 2021-10-07 VITALS — BP 110/80 | HR 70 | Ht 68.0 in | Wt 178.0 lb

## 2021-10-07 DIAGNOSIS — I340 Nonrheumatic mitral (valve) insufficiency: Secondary | ICD-10-CM | POA: Diagnosis not present

## 2021-10-07 DIAGNOSIS — I428 Other cardiomyopathies: Secondary | ICD-10-CM | POA: Diagnosis not present

## 2021-10-08 LAB — BASIC METABOLIC PANEL
BUN/Creatinine Ratio: 17 (ref 9–23)
BUN: 18 mg/dL (ref 6–20)
CO2: 24 mmol/L (ref 20–29)
Calcium: 9.9 mg/dL (ref 8.7–10.2)
Chloride: 104 mmol/L (ref 96–106)
Creatinine, Ser: 1.03 mg/dL — ABNORMAL HIGH (ref 0.57–1.00)
Glucose: 80 mg/dL (ref 70–99)
Potassium: 4.4 mmol/L (ref 3.5–5.2)
Sodium: 142 mmol/L (ref 134–144)
eGFR: 72 mL/min/{1.73_m2} (ref >59)

## 2021-10-08 LAB — PRO B NATRIURETIC PEPTIDE: NT-Pro BNP: 178 pg/mL — ABNORMAL HIGH (ref 0–130)

## 2021-10-11 DIAGNOSIS — R69 Illness, unspecified: Secondary | ICD-10-CM | POA: Diagnosis not present

## 2021-10-11 DIAGNOSIS — Z309 Encounter for contraceptive management, unspecified: Secondary | ICD-10-CM | POA: Diagnosis not present

## 2021-10-29 ENCOUNTER — Encounter: Payer: Self-pay | Admitting: Cardiovascular Disease

## 2021-10-29 MED ORDER — HYDRALAZINE HCL 10 MG PO TABS: 5.0000 mg | ORAL_TABLET | Freq: Four times a day (QID) | ORAL | 3 refills | Status: DC

## 2021-10-29 MED ORDER — POTASSIUM CHLORIDE ER 10 MEQ PO TBCR: 10.0000 meq | EXTENDED_RELEASE_TABLET | Freq: Every day | ORAL | 3 refills | Status: DC

## 2021-12-11 ENCOUNTER — Ambulatory Visit: Payer: 59 | Admitting: Cardiovascular Disease

## 2021-12-11 ENCOUNTER — Ambulatory Visit (HOSPITAL_COMMUNITY): Payer: 59

## 2021-12-19 NOTE — Progress Notes (Unsigned)
Cardiology Office Note:    Date:  12/19/2021   ID:  Rachael Hoover, DOB [birth year], MRN [id number]  PCP:  Emi Belfast, FNP  [facility] Providers Cardiologist:  None { Click to update primary MD,subspecialty MD or APP then REFRESH:1}  *** Referring MD: Emi Belfast, FNP   Chief Complaint:  No chief complaint on file. {Click here for Visit Info    :1}    History of Present Illness:   Rachael Hoover is a 36 y.o. 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 She has history of postpartum CM and bradycardia.  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  Patient saw Dr. Eden Emms 10/07/21 with improved EKG and symptoms. Echo today for ? Antecedent MVP with myxomatous valve and Mod MR in the setting of pregnancy and volume overload.    Past Medical History:  Diagnosis Date   Anxiety 2007   Bradycardia    Hypertension 2023   gestational   Infertility, female    Newborn product of in vitro fertilization (IVF) pregnancy    Current Medications: No outpatient medications have been marked as taking for the 12/25/21 encounter (Appointment) with Dyann Kief, PA-C.    Allergies:   Hydrocodone-acetaminophen   Social History   Tobacco Use   Smoking status: Never   Smokeless tobacco: Never  Vaping Use   Vaping Use: Never used  Substance Use Topics   Alcohol use: Not Currently    Comment: few days a week   Drug use: No    Family Hx: The patient's family history includes Anxiety disorder in her mother; Hypertension in her father.  ROS    EKGs/Labs/Other Test Reviewed:    EKG:  EKG is *** ordered today.  The ekg ordered today demonstrates ***  Recent Labs: 09/04/2021: B Natriuretic Peptide 395.7; TSH 1.830 09/05/2021: ALT 33; Hemoglobin 13.3; Magnesium 6.2; Platelets 337 10/07/2021: BUN 18; Creatinine, Ser 1.03; NT-Pro BNP 178; Potassium 4.4; Sodium 142   Recent Lipid Panel Recent Labs    09/06/21 0459  CHOL 245*  TRIG 107  HDL 87  VLDL 21  LDLCALC 137*     Prior CV Studies: ECHO COMPLETE WO IMAGING ENHANCING AGENT 09/04/2021  Narrative ECHOCARDIOGRAM REPORT    Patient Name:   Rachael Hoover Date of Exam: 09/04/2021 Medical Rec #:  [id number]      Height:       68.0 in Accession #:    [id number]     Weight:       195.5 lb Date of Birth:  1985/05/02      BSA:          2.024 m Patient Age:    36 years       BP:           136/92 mmHg Patient Gender: F              HR:           37 bpm. Exam Location:  Inpatient  Procedure: 2D Echo, Color  Doppler and Cardiac Doppler  Indications:     R00.1 Bradycardia, unspecified  History:         Patient has no prior history of Echocardiogram examinations. 5 days post partum.  Sonographer:     Irving Burton Senior RDCS Referring Phys:  [id number] Elisha Headland NEILL Diagnosing Phys: Orpah Cobb MD   Sonographer Comments: 0.5mg  of atropine given between pictures 84 and 85 IMPRESSIONS   1. Left ventricular ejection fraction, by estimation, is 50 to 55%. The left ventricle has low normal function. The left ventricle demonstrates regional wall motion abnormalities (see scoring diagram/findings for description). Left ventricular diastolic parameters were normal. There is mild hypokinesis of the left ventricular, basal-mid septal wall. 2. Right ventricular systolic function is normal. The right ventricular size is normal. There is mildly elevated pulmonary artery systolic pressure. 3. Left atrial size was mildly dilated. 4. Right atrial size was mildly dilated. 5. The mitral valve  is myxomatous. Moderate mitral valve regurgitation. 6. Tricuspid valve regurgitation is moderate. 7. The aortic valve is tricuspid. Aortic valve regurgitation is not visualized. 8. The inferior vena cava is dilated in size with <50% respiratory variability, suggesting right atrial pressure of 15 mmHg.  Conclusion(s)/Recommendation(s): Findings consistent with early postpartum cardiomyopathy.  FINDINGS Left Ventricle: Left ventricular ejection fraction, by estimation, is 50 to 55%. The left ventricle has low normal function. The left ventricle demonstrates regional wall motion abnormalities. Mild hypokinesis of the left ventricular, basal-mid septal wall. The left ventricular internal cavity size was normal in size. There is borderline asymmetric left ventricular hypertrophy of the inferior segment. Left ventricular diastolic parameters were normal.  Right Ventricle: The right ventricular size is normal. No increase in right ventricular wall thickness. Right ventricular systolic function is normal. There is mildly elevated pulmonary artery systolic pressure. The tricuspid regurgitant velocity is 2.32 m/s, and with an assumed right atrial pressure of 15 mmHg, the estimated right ventricular systolic pressure is 36.5 mmHg.  Left Atrium: Left atrial size was mildly dilated.  Right Atrium: Right atrial size was mildly dilated.  Pericardium: There is no evidence of pericardial effusion.  Mitral Valve: The mitral valve is myxomatous. Moderate mitral valve regurgitation.  Tricuspid Valve: The tricuspid valve is normal in structure. Tricuspid valve regurgitation is moderate.  Aortic Valve: The aortic valve is tricuspid. Aortic valve regurgitation is not visualized.  Pulmonic Valve: The pulmonic valve was normal in structure. Pulmonic valve regurgitation is not visualized.  Aorta: The aortic root is normal in size and structure.  Venous: The inferior vena cava is dilated in size with less than  50% respiratory variability, suggesting right atrial pressure of 15 mmHg.  IAS/Shunts: The atrial septum is grossly normal.   LEFT VENTRICLE PLAX 2D LVIDd:         5.10 cm      Diastology LVIDs:         3.70 cm      LV e' medial:    11.50 cm/s LV PW:         1.20 cm      LV E/e' medial:  9.6 LV IVS:        0.70 cm      LV e' lateral:   16.30 cm/s LVOT diam:     1.90 cm      LV E/e' lateral: 6.7 LV SV:         67 LV SV Index:   33 LVOT Area:     2.84 cm  LV Volumes (MOD) LV vol  d, MOD A4C: 113.0 ml LV vol s, MOD A4C: 51.0 ml LV SV MOD A4C:     113.0 ml  RIGHT VENTRICLE RV S prime:     13.70 cm/s TAPSE (M-mode): 2.6 cm  LEFT ATRIUM             Index        RIGHT ATRIUM           Index LA diam:        3.30 cm 1.63 cm/m   RA Area:     10.00 cm LA Vol (A2C):   46.7 ml 23.07 ml/m  RA Volume:   18.10 ml  8.94 ml/m LA Vol (A4C):   38.5 ml 19.02 ml/m LA Biplane Vol: 45.7 ml 22.58 ml/m AORTIC VALVE LVOT Vmax:   92.20 cm/s LVOT Vmean:  60.000 cm/s LVOT VTI:    0.236 m  AORTA Ao Root diam: 3.00 cm Ao Asc diam:  3.20 cm  MITRAL VALVE                TRICUSPID VALVE MV Area (PHT): 5.06 cm     TR Peak grad:   21.5 mmHg MV Decel Time: 150 msec     TR Vmax:        232.00 cm/s MV E velocity: 110.00 cm/s SHUNTS Systemic VTI:  0.24 m Systemic Diam: 1.90 cm  Orpah Cobb MD Electronically signed by Orpah Cobb MD Signature Date/Time: 09/05/2021/9:11:38 AM    Final   No results found for this or any previous visit from the past 3650 days.         Risk Assessment/Calculations/Metrics:   {Does this patient have ATRIAL FIBRILLATION?[phone]}     No BP recorded.  {Refresh Note OR Click here to enter BP  :1}***    Physical Exam:    VS:  There were no vitals taken for this visit.    Wt Readings from Last 3 Encounters:  10/07/21 178 lb (80.7 kg)  08/29/21 195 lb 8 oz (88.7 kg)  08/19/19 184 lb (83.5 kg)    Physical Exam  GEN: Well nourished, well developed, in  no acute distress  HEENT: normal  Neck: no JVD, carotid bruits, or masses Cardiac:RRR; no murmurs, rubs, or gallops  Respiratory:  clear to auscultation bilaterally, normal work of breathing GI: soft, nontender, nondistended, + BS Ext: without cyanosis, clubbing, or edema, Good distal pulses bilaterally MS: no deformity or atrophy  Skin: warm and dry, no rash Neuro:  Alert and Oriented x 3, Strength and sensation are intact Psych: euthymic mood, full affect       ASSESSMENT & PLAN:   No problem-specific Assessment & Plan notes found for this encounter.     DCM:  EF 50-55% with clinical post partum presentation CHF with elevated BNP dyspnea and CXR/CT showing edema Improved with lasix Pre term HTN but no eclampsia or protein in urine BP improved with lasix and hydralazine  Adjust dose of hydralazine to 10 bid for convenience Check BNP and BMET today TTE end of August to assess EF  Bradycardia:  no AV block normal ECG improved  MR:  ? Antecedent MVP with myxomatous valve Moderate MR in setting of pregnancy and volume overload will see if still moderate in August no that LV volume lower         {Are you ordering a CV Procedure (e.g. stress test, cath, DCCV, TEE, etc)?   Press F2        :[id number]}  Dispo:  No follow-ups on file.   Medication Adjustments/Labs and Tests Ordered: Current medicines are reviewed at length with the patient today.  Concerns regarding medicines are outlined above.  Tests Ordered: No orders of the defined types were placed in this encounter.  Medication Changes: No orders of the defined types were placed in this encounter.  Signed, Ermalinda Barrios, PA-C  12/19/2021 [address], [place], [place]  [address] Phone: [phone]; Fax: [phone]

## 2021-12-23 ENCOUNTER — Ambulatory Visit (HOSPITAL_COMMUNITY): Payer: 59 | Attending: Cardiovascular Disease

## 2021-12-23 DIAGNOSIS — I428 Other cardiomyopathies: Secondary | ICD-10-CM | POA: Insufficient documentation

## 2021-12-23 LAB — ECHOCARDIOGRAM COMPLETE
Area-P 1/2: 2.29 cm2
S' Lateral: 3 cm

## 2021-12-25 ENCOUNTER — Encounter: Payer: Self-pay | Admitting: Physician Assistant

## 2021-12-25 ENCOUNTER — Ambulatory Visit: Payer: 59 | Attending: Physician Assistant | Admitting: Physician Assistant

## 2021-12-25 VITALS — BP 112/78 | HR 77 | Ht 68.0 in | Wt 173.0 lb

## 2021-12-25 DIAGNOSIS — I428 Other cardiomyopathies: Secondary | ICD-10-CM

## 2021-12-25 DIAGNOSIS — I340 Nonrheumatic mitral (valve) insufficiency: Secondary | ICD-10-CM | POA: Diagnosis not present

## 2021-12-25 DIAGNOSIS — R001 Bradycardia, unspecified: Secondary | ICD-10-CM | POA: Diagnosis not present

## 2021-12-25 NOTE — Patient Instructions (Addendum)
Medication Instructions:  1.Stop Lasix, Hydralazine and Potassium *If you need a refill on your cardiac medications before your next appointment, please call your pharmacy*   Lab Work: None If you have labs (blood work) drawn today and your tests are completely normal, you will receive your results only by: MyChart Message (if you have MyChart) OR A paper copy in the mail If you have any lab test that is abnormal or we need to change your treatment, we will call you to review the results.   Follow-Up: At [facility], you and your health needs are our priority.  As part of our continuing mission to provide you with exceptional heart care, we have created designated Provider Care Teams.  These Care Teams include your primary Cardiologist (physician) and Advanced Practice Providers (APPs -  Physician Assistants and Nurse Practitioners) who all work together to provide you with the care you need, when you need it.   Your next appointment:   6 month(s)  The format for your next appointment:   In Person  Provider:   Dr Charlton Haws  Other Instructions 1.Monitor your blood pressure at home and call us if your readings are elevated at 135/85 or higher 2.You have been referred to see Dr Gala Romney at the CHF clinic   Two Gram Sodium Diet 2000 mg  What is Sodium? Sodium is a mineral found naturally in many foods. The most significant source of sodium in the diet is table salt, which is about 40% sodium.  Processed, convenience, and preserved foods also contain a large amount of sodium.  The body needs only 500 mg of sodium daily to function,  A normal diet provides more than enough sodium even if you do not use salt.  Why Limit Sodium? A build up of sodium in the body can cause thirst, increased blood pressure, shortness of breath, and water retention.  Decreasing sodium in the diet can reduce edema and risk of heart attack or stroke associated with high blood pressure.  Keep in mind  that there are many other factors involved in these health problems.  Heredity, obesity, lack of exercise, cigarette smoking, stress and what you eat all play a role.  General Guidelines: Do not add salt at the table or in cooking.  One teaspoon of salt contains over 2 grams of sodium. Read food labels Avoid processed and convenience foods Ask your dietitian before eating any foods not dicussed in the menu planning guidelines Consult your physician if you wish to use a salt substitute or a sodium containing medication such as antacids.  Limit milk and milk products to 16 oz (2 cups) per day.  Shopping Hints: READ LABELS!! "Dietetic" does not necessarily mean low sodium. Salt and other sodium ingredients are often added to foods during processing.    Menu Planning Guidelines Food Group Choose More Often Avoid  Beverages (see also the milk group All fruit juices, low-sodium, salt-free vegetables juices, low-sodium carbonated beverages Regular vegetable or tomato juices, commercially softened water used for drinking or cooking  Breads and Cereals Enriched white, wheat, rye and pumpernickel bread, hard rolls and dinner rolls; muffins, cornbread and waffles; most dry cereals, cooked cereal without added salt; unsalted crackers and breadsticks; low sodium or homemade bread crumbs Bread, rolls and crackers with salted tops; quick breads; instant hot cereals; pancakes; commercial bread stuffing; self-rising flower and biscuit mixes; regular bread crumbs or cracker crumbs  Desserts and Sweets Desserts and sweets mad with mild should be within  allowance Instant pudding mixes and cake mixes  Fats Butter or margarine; vegetable oils; unsalted salad dressings, regular salad dressings limited to 1 Tbs; light, sour and heavy cream Regular salad dressings containing bacon fat, bacon bits, and salt pork; snack dips made with instant soup mixes or processed cheese; salted nuts  Fruits Most fresh, frozen and  canned fruits Fruits processed with salt or sodium-containing ingredient (some dried fruits are processed with sodium sulfites        Vegetables Fresh, frozen vegetables and low- sodium canned vegetables Regular canned vegetables, sauerkraut, pickled vegetables, and others prepared in brine; frozen vegetables in sauces; vegetables seasoned with ham, bacon or salt pork  Condiments, Sauces, Miscellaneous  Salt substitute with physician's approval; pepper, herbs, spices; vinegar, lemon or lime juice; hot pepper sauce; garlic powder, onion powder, low sodium soy sauce (1 Tbs.); low sodium condiments (ketchup, chili sauce, mustard) in limited amounts (1 tsp.) fresh ground horseradish; unsalted tortilla chips, pretzels, potato chips, popcorn, salsa (1/4 cup) Any seasoning made with salt including garlic salt, celery salt, onion salt, and seasoned salt; sea salt, rock salt, kosher salt; meat tenderizers; monosodium glutamate; mustard, regular soy sauce, barbecue, sauce, chili sauce, teriyaki sauce, steak sauce, Worcestershire sauce, and most flavored vinegars; canned gravy and mixes; regular condiments; salted snack foods, olives, picles, relish, horseradish sauce, catsup   Food preparation: Try these seasonings Meats:    Pork Sage, onion Serve with applesauce  Chicken Poultry seasoning, thyme, parsley Serve with cranberry sauce  Lamb Curry powder, rosemary, garlic, thyme Serve with mint sauce or jelly  Veal Marjoram, basil Serve with current jelly, cranberry sauce  Beef Pepper, bay leaf Serve with dry mustard, unsalted chive butter  Fish Bay leaf, dill Serve with unsalted lemon butter, unsalted parsley butter  Vegetables:    Asparagus Lemon juice   Broccoli Lemon juice   Carrots Mustard dressing parsley, mint, nutmeg, glazed with unsalted butter and sugar   Green beans Marjoram, lemon juice, nutmeg,dill seed   Tomatoes Basil, marjoram, onion   Spice /blend for [employer]" 4 tsp ground thyme 1  tsp ground sage 3 tsp ground rosemary 4 tsp ground marjoram   Test your knowledge A product that says "Salt Free" may still contain sodium. True or False Garlic Powder and Hot Pepper Sauce an be used as alternative seasonings.True or False Processed foods have more sodium than fresh foods.  True or False Canned Vegetables have less sodium than froze True or False   WAYS TO DECREASE YOUR SODIUM INTAKE Avoid the use of added salt in cooking and at the table.  Table salt (and other prepared seasonings which contain salt) is probably one of the greatest sources of sodium in the diet.  Unsalted foods can gain flavor from the sweet, sour, and butter taste sensations of herbs and spices.  Instead of using salt for seasoning, try the following seasonings with the foods listed.  Remember: how you use them to enhance natural food flavors is limited only by your creativity... Allspice-Meat, fish, eggs, fruit, peas, red and yellow vegetables Almond Extract-Fruit baked goods Anise Seed-Sweet breads, fruit, carrots, beets, cottage cheese, cookies (tastes like licorice) Basil-Meat, fish, eggs, vegetables, rice, vegetables salads, soups, sauces Bay Leaf-Meat, fish, stews, poultry Burnet-Salad, vegetables (cucumber-like flavor) Caraway Seed-Bread, cookies, cottage cheese, meat, vegetables, cheese, rice Cardamon-Baked goods, fruit, soups Celery Powder or seed-Salads, salad dressings, sauces, meatloaf, soup, bread.Do not use  celery salt Chervil-Meats, salads, fish, eggs, vegetables, cottage cheese (parsley-like flavor) Chili Power-Meatloaf, chicken cheese,  corn, eggplant, egg dishes Chives-Salads cottage cheese, egg dishes, soups, vegetables, sauces Cilantro-Salsa, casseroles Cinnamon-Baked goods, fruit, pork, lamb, chicken, carrots Cloves-Fruit, baked goods, fish, pot roast, green beans, beets, carrots Coriander-Pastry, cookies, meat, salads, cheese (lemon-orange flavor) Cumin-Meatloaf, fish,cheese, eggs,  cabbage,fruit pie (caraway flavor) [employer], fruit, eggs, fish, poultry, cottage cheese, vegetables Dill Seed-Meat, cottage cheese, poultry, vegetables, fish, salads, bread Fennel Seed-Bread, cookies, apples, pork, eggs, fish, beets, cabbage, cheese, Licorice-like flavor Garlic-(buds or powder) Salads, meat, poultry, fish, bread, butter, vegetables, potatoes.Do not  use garlic salt Ginger-Fruit, vegetables, baked goods, meat, fish, poultry Horseradish Root-Meet, vegetables, butter Lemon Juice or Extract-Vegetables, fruit, tea, baked goods, fish salads Mace-Baked goods fruit, vegetables, fish, poultry (taste like nutmeg) Maple Extract-Syrups Marjoram-Meat, chicken, fish, vegetables, breads, green salads (taste like Sage) Mint-Tea, lamb, sherbet, vegetables, desserts, carrots, cabbage Mustard, Dry or Seed-Cheese, eggs, meats, vegetables, poultry Nutmeg-Baked goods, fruit, chicken, eggs, vegetables, desserts Onion Powder-Meat, fish, poultry, vegetables, cheese, eggs, bread, rice salads (Do not use   Onion salt) Orange Extract-Desserts, baked goods Oregano-Pasta, eggs, cheese, onions, pork, lamb, fish, chicken, vegetables, green salads Paprika-Meat, fish, poultry, eggs, cheese, vegetables Parsley Flakes-Butter, vegetables, meat fish, poultry, eggs, bread, salads (certain forms may   Contain sodium Pepper-Meat fish, poultry, vegetables, eggs Peppermint Extract-Desserts, baked goods Poppy Seed-Eggs, bread, cheese, fruit dressings, baked goods, noodles, vegetables, cottage  [employer], poultry, meat, fish, cauliflower, turnips,eggs bread Saffron-Rice, bread, veal, chicken, fish, eggs Sage-Meat, fish, poultry, onions, eggplant, tomateos, pork, stews Savory-Eggs, salads, poultry, meat, rice, vegetables, soups, pork Tarragon-Meat, poultry, fish, eggs, butter, vegetables (licorice-like flavor)  Thyme-Meat, poultry, fish, eggs, vegetables,  (clover-like flavor), sauces, soups Tumeric-Salads, butter, eggs, fish, rice, vegetables (saffron-like flavor) Vanilla Extract-Baked goods, candy Vinegar-Salads, vegetables, meat marinades Walnut Extract-baked goods, candy   2. Choose your Foods Wisely   The following is a list of foods to avoid which are high in sodium:  Meats-Avoid all smoked, canned, salt cured, dried and kosher meat and fish as well as Anchovies   [employer] meats:Bologna, Liverwurst, Pastrami Canned meat or fish  Marinated herring Caviar    Pepperoni Corned Beef   Pizza Dried chipped beef  Salami Frozen breaded fish or meat Salt pork Frankfurters or hot dogs  Sardines Gefilte fish   Sausage Ham (boiled ham, Proscuitto Smoked butt    spiced ham)   Spam      TV Dinners Vegetables Canned vegetables (Regular) Relish Canned mushrooms  Sauerkraut Olives    Tomato juice Pickles  Bakery and Dessert Products Canned puddings  Cream pies Cheesecake   Decorated cakes Cookies  Beverages/Juices Tomato juice, regular  Gatorade   V-8 vegetable juice, regular  Breads and Cereals Biscuit mixes   Salted potato chips, corn chips, pretzels Bread stuffing mixes  Salted crackers and rolls Pancake and waffle mixes Self-rising flour  Seasonings Accent    Meat sauces Barbecue sauce  Meat tenderizer Catsup    Monosodium glutamate (MSG) Celery salt   Onion salt Chili sauce   Prepared mustard Garlic salt   Salt, seasoned salt, sea salt Gravy mixes   Soy sauce Horseradish   Steak sauce Ketchup   Tartar sauce Lite salt    Teriyaki sauce Marinade mixes   Worcestershire sauce  Others Baking powder   Cocoa and cocoa mixes Baking soda   Commercial casserole mixes Candy-caramels, chocolate  Dehydrated soups    Bars, fudge,nougats  Instant rice and pasta mixes Canned broth or soup  Maraschino cherries Cheese, aged and  processed cheese and cheese spreads  Learning Assessment Quiz  Indicated T (for True) or  F (for False) for each of the following statements:  _____ Fresh fruits and vegetables and unprocessed grains are generally low in sodium _____ Water may contain a considerable amount of sodium, depending on the source _____ You can always tell if a food is high in sodium by tasting it _____ Certain laxatives my be high in sodium and should be avoided unless prescribed   by a physician or pharmacist _____ Salt substitutes may be used freely by anyone on a sodium restricted diet _____ Sodium is present in table salt, food additives and as a natural component of   most foods _____ Table salt is approximately 90% sodium _____ Limiting sodium intake may help prevent excess fluid accumulation in the body _____ On a sodium-restricted diet, seasonings such as bouillon soy sauce, and    cooking wine should be used in place of table salt _____ On an ingredient list, a product which lists monosodium glutamate as the first   ingredient is an appropriate food to include on a low sodium diet  Circle the best answer(s) to the following statements (Hint: there may be more than one correct answer)  11. On a low-sodium diet, some acceptable snack items are:    A. Olives  F. Bean dip   K. Grapefruit juice    B. Salted Pretzels G. Commercial Popcorn   L. Canned peaches    C. Carrot Sticks  H. Bouillon   M. Unsalted nuts   D. [place] fries  I. Peanut butter crackers N. Salami   E. Sweet pickles J. Tomato Juice   O. Pizza  12.  Seasonings that may be used freely on a reduced - sodium diet include   A. Lemon wedges F.Monosodium glutamate K. Celery seed    B.Soysauce   G. Pepper   L. Mustard powder   C. Sea salt  H. Cooking wine  M. Onion flakes   D. Vinegar  E. Prepared horseradish N. Salsa   E. Sage   J. Worcestershire sauce  O. Chutney   Important Information About Sugar

## 2022-01-02 DIAGNOSIS — M222X1 Patellofemoral disorders, right knee: Secondary | ICD-10-CM | POA: Diagnosis not present

## 2022-02-10 DIAGNOSIS — R69 Illness, unspecified: Secondary | ICD-10-CM | POA: Diagnosis not present

## 2022-02-10 DIAGNOSIS — F4323 Adjustment disorder with mixed anxiety and depressed mood: Secondary | ICD-10-CM | POA: Diagnosis not present

## 2022-02-26 DIAGNOSIS — F4323 Adjustment disorder with mixed anxiety and depressed mood: Secondary | ICD-10-CM | POA: Diagnosis not present

## 2022-02-26 DIAGNOSIS — R69 Illness, unspecified: Secondary | ICD-10-CM | POA: Diagnosis not present

## 2022-03-12 DIAGNOSIS — J018 Other acute sinusitis: Secondary | ICD-10-CM | POA: Diagnosis not present

## 2022-03-21 DIAGNOSIS — R69 Illness, unspecified: Secondary | ICD-10-CM | POA: Diagnosis not present

## 2022-03-21 DIAGNOSIS — F4323 Adjustment disorder with mixed anxiety and depressed mood: Secondary | ICD-10-CM | POA: Diagnosis not present

## 2022-04-10 DIAGNOSIS — R69 Illness, unspecified: Secondary | ICD-10-CM | POA: Diagnosis not present

## 2022-04-10 DIAGNOSIS — F4323 Adjustment disorder with mixed anxiety and depressed mood: Secondary | ICD-10-CM | POA: Diagnosis not present

## 2022-04-16 DIAGNOSIS — J328 Other chronic sinusitis: Secondary | ICD-10-CM | POA: Diagnosis not present

## 2022-04-18 DIAGNOSIS — K529 Noninfective gastroenteritis and colitis, unspecified: Secondary | ICD-10-CM | POA: Diagnosis not present

## 2022-04-24 ENCOUNTER — Telehealth (HOSPITAL_COMMUNITY): Payer: Self-pay | Admitting: Vascular Surgery

## 2022-04-24 NOTE — Telephone Encounter (Signed)
Lvm to make new chf appt/ when pt calls back please ask if she wants to see db in [place]

## 2022-05-01 DIAGNOSIS — F4323 Adjustment disorder with mixed anxiety and depressed mood: Secondary | ICD-10-CM | POA: Diagnosis not present

## 2022-05-01 DIAGNOSIS — R69 Illness, unspecified: Secondary | ICD-10-CM | POA: Diagnosis not present

## 2022-05-29 DIAGNOSIS — F4323 Adjustment disorder with mixed anxiety and depressed mood: Secondary | ICD-10-CM | POA: Diagnosis not present

## 2022-05-29 DIAGNOSIS — R69 Illness, unspecified: Secondary | ICD-10-CM | POA: Diagnosis not present

## 2022-06-18 NOTE — Progress Notes (Signed)
CARDIOLOGY CONSULT NOTE       Patient ID: Rachael Hoover MRN: [id number] DOB/AGE: [birth year] 37 y.o.  Admit date: (Not on file) Referring Physician: Vanessa Kick Primary Physician: Patient, No Pcp Per Primary Cardiologist: Johnsie Cancel   HPI:  37 y.o. referred by Dr Harrington Challenger for postpartum DCM and bradycardia. First seen in hospital and as outpatient on 10/07/21 She is the sister of my old nurse Sherran Needs. 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   TTE 12/23/21 EF 60-65% trivial MR no prolapse   [name] her daughter is doing well and not quite walking yet   ROS All other systems reviewed and negative except as noted above  Past Medical History:  Diagnosis Date   Anxiety 2007   Bradycardia    Hypertension 2023   gestational   Infertility, female    Newborn product of in vitro fertilization (IVF) pregnancy     Family History  Problem Relation Age of Onset   Anxiety disorder Mother    Hypertension Father     Social History   Socioeconomic History   Marital  status: Married    Spouse name: Not on file   Number of children: Not on file   Years of education: Not on file   Highest education level: Not on file  Occupational History   Not on file  Tobacco Use   Smoking status: Never   Smokeless tobacco: Never  Vaping Use   Vaping Use: Never used  Substance and Sexual Activity   Alcohol use: Not Currently    Comment: few days a week   Drug use: No   Sexual activity: Not Currently  Other Topics Concern   Not on file  Social History Narrative   Not on file   Social Determinants of Health   Financial Resource Strain: Not on file  Food Insecurity: Not on file  Transportation Needs: Not on file  Physical Activity: Not on file  Stress: Not on file  Social Connections: Not on file  Intimate Partner Violence: Not on file    Past Surgical History:  Procedure Laterality Date   HERNIA REPAIR     when a small child - inguinal ?   ivf     TONSILLECTOMY        Current Outpatient Medications:    norethindrone (MICRONOR) 0.35 MG tablet, Take 1 tablet by mouth daily., Disp: , Rfl:    Prenatal Vit-Fe Fumarate-FA (PRENATAL MULTIVITAMIN)  TABS tablet, Take 1 tablet by mouth daily at 12 noon., Disp: , Rfl:    sertraline (ZOLOFT) 50 MG tablet, Take 50 mg by mouth daily., Disp: , Rfl:     Physical Exam: Blood pressure 122/80, pulse 75, height 5\' 8"  (1.727 m), weight 185 lb 6.4 oz (84.1 kg), SpO2 98 %, currently breastfeeding.    Affect appropriate Healthy:  appears stated age 37: normal Neck supple with no adenopathy JVP normal no bruits no thyromegaly Lungs clear with no wheezing and good diaphragmatic motion Heart:  S1/S2 no murmur, no rub, gallop or click PMI normal Abdomen: benighn, BS positve, no tenderness, no AAA no bruit.  No HSM or HJR Distal pulses intact with no bruits No edema Neuro non-focal Skin warm and dry No muscular weakness   Labs:   Lab Results  Component Value Date   WBC 8.8 09/05/2021   HGB 13.3  09/05/2021   HCT 38.5 09/05/2021   MCV 88.5 09/05/2021   PLT 337 09/05/2021   No results for input(s): "NA", "K", "CL", "CO2", "BUN", "CREATININE", "CALCIUM", "PROT", "BILITOT", "ALKPHOS", "ALT", "AST", "GLUCOSE" in the last 168 hours.  Invalid input(s): "LABALBU" No results found for: "CKTOTAL", "CKMB", "CKMBINDEX", "TROPONINI"  Lab Results  Component Value Date   CHOL 245 (H) 09/06/2021   CHOL 205 (H) 08/19/2019   Lab Results  Component Value Date   HDL 87 09/06/2021   HDL 81.20 08/19/2019   Lab Results  Component Value Date   LDLCALC 137 (H) 09/06/2021   LDLCALC 113 (H) 08/19/2019   Lab Results  Component Value Date   TRIG 107 09/06/2021   TRIG 56.0 08/19/2019   Lab Results  Component Value Date   CHOLHDL 2.8 09/06/2021   CHOLHDL 3 08/19/2019   No results found for: "LDLDIRECT"    Radiology: No results found.  EKG: See HPI   ASSESSMENT AND PLAN:    DCM:  EF 50-55% with clinical post partum presentation CHF with elevated BNP dyspnea and CXR/CT showing edema Improved with lasix Pre term HTN but no eclampsia or protein in urine BP improved with lasix and hydralazine  TTE 12/23/21 with return to normal EF and now off diuretic and hydralazine   Bradycardia:  no AV block normal ECG improved  MR:  ? Antecedent MVP with myxomatous valve Moderate MR in setting of pregnancy and volume overload updated TTE 12/23/21 with no prolapse and trivial MR  Anxiety/Depression:  continue Zoloft Birth Control:  micronor f/u OB/GYN   F/U PRN unless she gets pregnant again   Signed: Jenkins Rouge 06/27/2022, 8:31 AM

## 2022-06-24 DIAGNOSIS — F411 Generalized anxiety disorder: Secondary | ICD-10-CM | POA: Diagnosis not present

## 2022-06-24 DIAGNOSIS — F4323 Adjustment disorder with mixed anxiety and depressed mood: Secondary | ICD-10-CM | POA: Diagnosis not present

## 2022-06-27 ENCOUNTER — Encounter: Payer: Self-pay | Admitting: Cardiovascular Disease

## 2022-06-27 ENCOUNTER — Ambulatory Visit: Payer: 59 | Attending: Cardiovascular Disease | Admitting: Cardiovascular Disease

## 2022-06-27 VITALS — BP 122/80 | HR 75 | Ht 68.0 in | Wt 185.4 lb

## 2022-06-27 DIAGNOSIS — I340 Nonrheumatic mitral (valve) insufficiency: Secondary | ICD-10-CM

## 2022-06-27 DIAGNOSIS — I428 Other cardiomyopathies: Secondary | ICD-10-CM

## 2022-06-27 DIAGNOSIS — R001 Bradycardia, unspecified: Secondary | ICD-10-CM | POA: Diagnosis not present

## 2022-06-27 NOTE — Patient Instructions (Signed)
Medication Instructions:  Your physician recommends that you continue on your current medications as directed. Please refer to the Current Medication list given to you today.  *If you need a refill on your cardiac medications before your next appointment, please call your pharmacy*  Lab Work: If you have labs (blood work) drawn today and your tests are completely normal, you will receive your results only by: [place] (if you have MyChart) OR A paper copy in the mail If you have any lab test that is abnormal or we need to change your treatment, we will call you to review the results.  Testing/Procedures: None ordered today.  Follow-Up: At [facility], you and your health needs are our priority.  As part of our continuing mission to provide you with exceptional heart care, we have created designated Provider Care Teams.  These Care Teams include your primary Cardiologist (physician) and Advanced Practice Providers (APPs -  Physician Assistants and Nurse Practitioners) who all work together to provide you with the care you need, when you need it.  We recommend signing up for the patient portal called "MyChart".  Sign up information is provided on this After Visit Summary.  MyChart is used to connect with patients for Virtual Visits (Telemedicine).  Patients are able to view lab/test results, encounter notes, upcoming appointments, etc.  Non-urgent messages can be sent to your provider as well.   To learn more about what you can do with MyChart, go to [email or web address].    Your next appointment:   As needed  Provider:   Dr. Johnsie Cancel

## 2022-07-15 DIAGNOSIS — F4323 Adjustment disorder with mixed anxiety and depressed mood: Secondary | ICD-10-CM | POA: Diagnosis not present

## 2022-07-15 DIAGNOSIS — F411 Generalized anxiety disorder: Secondary | ICD-10-CM | POA: Diagnosis not present

## 2022-08-05 DIAGNOSIS — F4323 Adjustment disorder with mixed anxiety and depressed mood: Secondary | ICD-10-CM | POA: Diagnosis not present

## 2022-08-05 DIAGNOSIS — F411 Generalized anxiety disorder: Secondary | ICD-10-CM | POA: Diagnosis not present

## 2022-08-06 DIAGNOSIS — H10029 Other mucopurulent conjunctivitis, unspecified eye: Secondary | ICD-10-CM | POA: Diagnosis not present

## 2022-08-06 DIAGNOSIS — H9209 Otalgia, unspecified ear: Secondary | ICD-10-CM | POA: Diagnosis not present

## 2022-08-19 ENCOUNTER — Encounter: Payer: Self-pay | Admitting: Physician Assistant

## 2022-08-19 ENCOUNTER — Ambulatory Visit (INDEPENDENT_AMBULATORY_CARE_PROVIDER_SITE_OTHER): Payer: 59 | Admitting: Physician Assistant

## 2022-08-19 VITALS — BP 127/85 | HR 82 | Ht 69.0 in | Wt 189.0 lb

## 2022-08-19 DIAGNOSIS — F419 Anxiety disorder, unspecified: Secondary | ICD-10-CM | POA: Diagnosis not present

## 2022-08-19 DIAGNOSIS — R5383 Other fatigue: Secondary | ICD-10-CM | POA: Diagnosis not present

## 2022-08-19 MED ORDER — SERTRALINE HCL 50 MG PO TABS: 50.0000 mg | ORAL_TABLET | Freq: Every day | ORAL | 1 refills | Status: DC

## 2022-08-19 NOTE — Progress Notes (Unsigned)
Rachael Hoover,Sha'taria Tyson,acting as a Neurosurgeon for [employer], PA-C.,have documented all relevant documentation on the behalf of [name], PA-C,as directed by  [employer], PA-C while in the presence of [employer], PA-C.  New patient visit   Patient: Rachael Hoover   DOB: August 04, 1985   37 y.o. Female  MRN: [id number] Visit Date: 08/19/2022  Today's healthcare provider: [name], PA-C   No chief complaint on file.  Subjective    Rachael Hoover is a 37 y.o. female who presents today as a new patient to establish care.  HPI  Well Adult Physical: Patient here for a comprehensive physical exam.The patient reports no problems Do you take any herbs or supplements that were not prescribed by a doctor? no Are you taking calcium supplements? no Are you taking aspirin daily? no GU History: LMP: No LMP recorded. (Menstrual status: Lactating). Menopause at 1 years Last pap date: 1- 2 years ago Abnormal pap? yes Gravida: *** Para: ***      08/19/2022    9:00 AM  GAD 7 : Generalized Anxiety Score  Nervous, Anxious, on Edge 1  Control/stop worrying 1  Worry too much - different things 1  Trouble relaxing 0  Restless 0  Easily annoyed or irritable 1  Afraid - awful might happen 2  Total GAD 7 Score 6  Anxiety Difficulty Not difficult at all     Past Medical History:  Diagnosis Date   Anxiety 2007   Bradycardia    Hypertension 2023   gestational   Infertility, female    Newborn product of in vitro fertilization (IVF) pregnancy    Past Surgical History:  Procedure Laterality Date   HERNIA REPAIR     when a small child - inguinal ?   ivf     TONSILLECTOMY     Family Status  Relation Name Status   Mother  Alive   Father  Alive   MGM  Deceased   MGF  Deceased   PGM  Deceased   PGF  Deceased   Family History  Problem Relation Age of Onset   Anxiety disorder Mother    Hypertension Father    Social History   Socioeconomic History   Marital status: Married     Spouse name: Not on file   Number of children: Not on file   Years of education: Not on file   Highest education level: Not on file  Occupational History   Not on file  Tobacco Use   Smoking status: Never   Smokeless tobacco: Never  Vaping Use   Vaping Use: Never used  Substance and Sexual Activity   Alcohol use: Not Currently    Comment: few days a week   Drug use: No   Sexual activity: Not Currently  Other Topics Concern   Not on file  Social History Narrative   Not on file   Social Determinants of Health   Financial Resource Strain: Not on file  Food Insecurity: Not on file  Transportation Needs: Not on file  Physical Activity: Not on file  Stress: Not on file  Social Connections: Not on file   Outpatient Medications Prior to Visit  Medication Sig   norethindrone (MICRONOR) 0.35 MG tablet Take 1 tablet by mouth daily.   Prenatal Vit-Fe Fumarate-FA (PRENATAL MULTIVITAMIN) TABS tablet Take 1 tablet by mouth daily at 12 noon.   sertraline (ZOLOFT) 50 MG tablet Take 50 mg by mouth daily.   No facility-administered medications prior to  visit.   Allergies  Allergen Reactions   Hydrocodone-Acetaminophen Nausea And Vomiting    Patient states she takes tylenol by itself with no reaction "Violent"vomiting    Immunization History  Administered Date(s) Administered   Influenza,inj,Quad PF,6+ Mos 01/14/2019   Influenza,inj,Quad PF,6-35 Mos 02/06/2022   PFIZER(Purple Top)SARS-COV-2 Vaccination 06/23/2019, 07/14/2019   Tdap 06/18/2021    Health Maintenance  Topic Date Due   PAP SMEAR-Modifier  11/27/2019   COVID-19 Vaccine (3 - 2023-24 season) 12/13/2021   INFLUENZA VACCINE  11/13/2022   DTaP/Tdap/Td (2 - Td or Tdap) 06/19/2031   Hepatitis C Screening  Completed   HIV Screening  Completed   HPV VACCINES  Aged Out    Patient Care Team: Patient, No Pcp Per as PCP - General (General Practice)  Review of Systems  Constitutional:  Positive for diaphoresis.  HENT:   Positive for ear pain and sore throat.   Eyes:  Positive for pain.  Musculoskeletal:  Positive for arthralgias.  Skin:  Positive for rash.  Psychiatric/Behavioral:  The patient is nervous/anxious.     {Labs  Heme  Chem  Endocrine  Serology  Results Review (optional):23779}   Objective    There were no vitals taken for this visit. {Show previous vital signs (optional):23777}  Physical Exam ***  Depression Screen    08/19/2019    8:24 AM  PHQ 2/9 Scores  PHQ - 2 Score 0   No results found for any visits on 08/19/22.  Assessment & Plan     ***  No follow-ups on file.     {provider attestation***:1}   [name], PA-C  [facility] [phone] (phone) [phone] (fax)  [facility]

## 2022-08-20 LAB — COMPREHENSIVE METABOLIC PANEL
ALT: 15 IU/L (ref 0–32)
AST: 19 IU/L (ref 0–40)
Albumin/Globulin Ratio: 1.8 (ref 1.2–2.2)
Albumin: 4.6 g/dL (ref 3.9–4.9)
Alkaline Phosphatase: 89 IU/L (ref 44–121)
BUN/Creatinine Ratio: 18 (ref 9–23)
BUN: 16 mg/dL (ref 6–20)
Bilirubin Total: 0.6 mg/dL (ref 0.0–1.2)
CO2: 21 mmol/L (ref 20–29)
Calcium: 9.8 mg/dL (ref 8.7–10.2)
Chloride: 104 mmol/L (ref 96–106)
Creatinine, Ser: 0.89 mg/dL (ref 0.57–1.00)
Globulin, Total: 2.6 g/dL (ref 1.5–4.5)
Glucose: 79 mg/dL (ref 70–99)
Potassium: 5.1 mmol/L (ref 3.5–5.2)
Sodium: 140 mmol/L (ref 134–144)
Total Protein: 7.2 g/dL (ref 6.0–8.5)
eGFR: 85 mL/min/{1.73_m2} (ref >59)

## 2022-08-20 LAB — LIPID PANEL
Chol/HDL Ratio: 2.7 ratio (ref 0.0–4.4)
Cholesterol, Total: 218 mg/dL — ABNORMAL HIGH (ref 100–199)
HDL: 80 mg/dL (ref >39)
LDL Chol Calc (NIH): 123 mg/dL — ABNORMAL HIGH (ref 0–99)
Triglycerides: 84 mg/dL (ref 0–149)
VLDL Cholesterol Cal: 15 mg/dL (ref 5–40)

## 2022-08-20 LAB — CBC WITH DIFFERENTIAL/PLATELET
Basophils Absolute: 0 10*3/uL (ref 0.0–0.2)
Basos: 1 %
EOS (ABSOLUTE): 0.1 10*3/uL (ref 0.0–0.4)
Eos: 1 %
Hematocrit: 44.4 % (ref 34.0–46.6)
Hemoglobin: 14.7 g/dL (ref 11.1–15.9)
Immature Grans (Abs): 0 10*3/uL (ref 0.0–0.1)
Immature Granulocytes: 0 %
Lymphocytes Absolute: 1.7 10*3/uL (ref 0.7–3.1)
Lymphs: 29 %
MCH: 29.3 pg (ref 26.6–33.0)
MCHC: 33.1 g/dL (ref 31.5–35.7)
MCV: 88 fL (ref 79–97)
Monocytes Absolute: 0.5 10*3/uL (ref 0.1–0.9)
Monocytes: 8 %
Neutrophils Absolute: 3.7 10*3/uL (ref 1.4–7.0)
Neutrophils: 61 %
Platelets: 342 10*3/uL (ref 150–450)
RBC: 5.02 x10E6/uL (ref 3.77–5.28)
RDW: 12.4 % (ref 11.7–15.4)
WBC: 6 10*3/uL (ref 3.4–10.8)

## 2022-08-20 LAB — TSH: TSH: 1.06 u[IU]/mL (ref 0.450–4.500)

## 2022-08-20 NOTE — Progress Notes (Signed)
Please, let pt know that her lab results are WNL except lipids. Advised low cholesterol diet and daily exercise. And weight loss of 5 % of her current weight. Pt could start OTC supplements such as red rice yeast extract with coenzyme Q10 for lipid/cholesterol control.

## 2022-08-26 DIAGNOSIS — F4323 Adjustment disorder with mixed anxiety and depressed mood: Secondary | ICD-10-CM | POA: Diagnosis not present

## 2022-08-26 DIAGNOSIS — F411 Generalized anxiety disorder: Secondary | ICD-10-CM | POA: Diagnosis not present

## 2022-08-27 NOTE — Progress Notes (Signed)
Pap smear abstracted

## 2022-09-16 DIAGNOSIS — Z1389 Encounter for screening for other disorder: Secondary | ICD-10-CM | POA: Diagnosis not present

## 2022-09-16 DIAGNOSIS — Z113 Encounter for screening for infections with a predominantly sexual mode of transmission: Secondary | ICD-10-CM | POA: Diagnosis not present

## 2022-09-16 DIAGNOSIS — Z309 Encounter for contraceptive management, unspecified: Secondary | ICD-10-CM | POA: Diagnosis not present

## 2022-09-16 DIAGNOSIS — Z01419 Encounter for gynecological examination (general) (routine) without abnormal findings: Secondary | ICD-10-CM | POA: Diagnosis not present

## 2022-09-16 DIAGNOSIS — Z202 Contact with and (suspected) exposure to infections with a predominantly sexual mode of transmission: Secondary | ICD-10-CM | POA: Diagnosis not present

## 2022-09-16 DIAGNOSIS — Z3040 Encounter for surveillance of contraceptives, unspecified: Secondary | ICD-10-CM | POA: Diagnosis not present

## 2022-09-23 DIAGNOSIS — F4323 Adjustment disorder with mixed anxiety and depressed mood: Secondary | ICD-10-CM | POA: Diagnosis not present

## 2022-09-23 DIAGNOSIS — F411 Generalized anxiety disorder: Secondary | ICD-10-CM | POA: Diagnosis not present

## 2022-10-07 DIAGNOSIS — F4323 Adjustment disorder with mixed anxiety and depressed mood: Secondary | ICD-10-CM | POA: Diagnosis not present

## 2022-10-07 DIAGNOSIS — F411 Generalized anxiety disorder: Secondary | ICD-10-CM | POA: Diagnosis not present

## 2022-10-28 DIAGNOSIS — F411 Generalized anxiety disorder: Secondary | ICD-10-CM | POA: Diagnosis not present

## 2022-10-28 DIAGNOSIS — F4323 Adjustment disorder with mixed anxiety and depressed mood: Secondary | ICD-10-CM | POA: Diagnosis not present

## 2022-11-11 DIAGNOSIS — F4323 Adjustment disorder with mixed anxiety and depressed mood: Secondary | ICD-10-CM | POA: Diagnosis not present

## 2022-11-11 DIAGNOSIS — F411 Generalized anxiety disorder: Secondary | ICD-10-CM | POA: Diagnosis not present

## 2022-11-19 NOTE — Progress Notes (Signed)
Established patient visit  Patient: Rachael Hoover   DOB: 07-Jun-1985   37 y.o. Female  MRN: [id number] Visit Date: 11/20/2022  Today's healthcare provider: [name], PA-C   Chief Complaint  Patient presents with   Medical Management of Chronic Issues    3 month follow up on Anxiety    Subjective    HPI  Anxiety, Follow-up  She was last seen for anxiety 3 months ago. Changes made at last visit include zoloft 50mg /long-term therapy.   She reports good compliance with treatment. She reports good tolerance of treatment. She is not having side effects.   She feels her anxiety is mild and Unchanged since last visit.    GAD-7 Results    11/20/2022    8:16 AM 08/19/2022    9:00 AM  GAD-7 Generalized Anxiety Disorder Screening Tool  1. Feeling Nervous, Anxious, or on Edge 1 1  2. Not Being Able to Stop or Control Worrying 0 1  3. Worrying Too Much About Different Things 1 1  4. Trouble Relaxing 0 0  5. Being So Restless it's Hard To Sit Still 0 0  6. Becoming Easily Annoyed or Irritable 1 1  7. Feeling Afraid As If Something Awful Might Happen 1 2  Total GAD-7 Score 4 6  Difficulty At Work, Home, or Getting  Along With Others? Somewhat difficult Not difficult at all    PHQ-9 Scores    11/20/2022    8:16 AM 08/19/2022    9:09 AM 08/19/2019    8:24 AM  PHQ9 SCORE ONLY  PHQ-9 Total Score 5 5 0    ---------------------------------------------------------------------------------------------------      Medications: Outpatient Medications Prior to Visit  Medication Sig   norethindrone (MICRONOR) 0.35 MG tablet Take 1 tablet by mouth daily.   Prenatal Vit-Fe Fumarate-FA (PRENATAL MULTIVITAMIN) TABS tablet Take 1 tablet by mouth daily at 12 noon.   sertraline (ZOLOFT) 50 MG tablet Take 1 tablet (50 mg total) by mouth daily. Take 50 mg by mouth daily.   No facility-administered medications prior to visit.    Review of Systems  All other systems reviewed and are  negative.  Except see HPI       Objective    BP 119/89 (BP Location: Left Arm, Patient Position: Sitting, Cuff Size: Large)   Pulse 96   Temp 97.9 F (36.6 C) (Oral)   Ht 5\' 8"  (1.727 m)   Wt 196 lb 4.8 oz (89 kg)   SpO2 100%   BMI 29.85 kg/m     Physical Exam Vitals reviewed.  Constitutional:      General: She is not in acute distress.    Appearance: Normal appearance. She is well-developed. She is not diaphoretic.  HENT:     Head: Normocephalic and atraumatic.  Eyes:     General: No scleral icterus.    Extraocular Movements: Extraocular movements intact.     Conjunctiva/sclera: Conjunctivae normal.     Pupils: Pupils are equal, round, and reactive to light.  Neck:     Thyroid: No thyromegaly.  Cardiovascular:     Rate and Rhythm: Normal rate and regular rhythm.     Pulses: Normal pulses.     Heart sounds: Normal heart sounds. No murmur heard. Pulmonary:     Effort: Pulmonary effort is normal. No respiratory distress.     Breath sounds: Normal breath sounds. No wheezing, rhonchi or rales.  Musculoskeletal:     Cervical back: Neck supple.     Right lower  leg: No edema.     Left lower leg: No edema.  Lymphadenopathy:     Cervical: No cervical adenopathy.  Skin:    General: Skin is warm and dry.     Findings: No rash.  Neurological:     Mental Status: She is alert and oriented to person, place, and time. Mental status is at baseline.  Psychiatric:        Mood and Affect: Mood normal.        Behavior: Behavior normal.      No results found for any visits on 11/20/22.  Assessment & Plan    1. Pain in lateral portion of right knee Chronic. Hx of IT band syndrome when she was younger. Requested an evaluation with Ortho - Ambulatory referral to Orthopedics Will refer to PT if pt agrees  Anxiety Relaxation/mindfulness techniques were recommended. Continue SSRIs/zoloft 50mg , see above Psychodynamic psychotherapy might be considered : patient discovering  and verbalizing unconscious conflicts   Benefits and risks were discussed. Patient agreed and expressed his understanding.   The patient was advised to call back or seek an in-person evaluation if the symptoms worsen or if the condition fails to improve as anticipated.         Return in about 9 months (around 08/20/2023).    The patient was advised to call back or seek an in-person evaluation if the symptoms worsen or if the condition fails to improve as anticipated.  I discussed the assessment and treatment plan with the patient. The patient was provided an opportunity to ask questions and all were answered. The patient agreed with the plan and demonstrated an understanding of the instructions.  I, [name], PA-C have reviewed all documentation for this visit. The documentation on  11/20/22 for the exam, diagnosis, procedures, and orders are all accurate and complete.  [name], [facility], MMS [facility] [phone] (phone) [phone] (fax)   [facility]

## 2022-11-20 ENCOUNTER — Encounter: Payer: Self-pay | Admitting: Physician Assistant

## 2022-11-20 ENCOUNTER — Ambulatory Visit (INDEPENDENT_AMBULATORY_CARE_PROVIDER_SITE_OTHER): Payer: 59 | Admitting: Physician Assistant

## 2022-11-20 VITALS — BP 119/89 | HR 96 | Temp 97.9°F | Ht 68.0 in | Wt 196.3 lb

## 2022-11-20 DIAGNOSIS — F419 Anxiety disorder, unspecified: Secondary | ICD-10-CM

## 2022-11-20 DIAGNOSIS — M25561 Pain in right knee: Secondary | ICD-10-CM

## 2022-12-02 DIAGNOSIS — F411 Generalized anxiety disorder: Secondary | ICD-10-CM | POA: Diagnosis not present

## 2022-12-02 DIAGNOSIS — F4323 Adjustment disorder with mixed anxiety and depressed mood: Secondary | ICD-10-CM | POA: Diagnosis not present

## 2022-12-16 ENCOUNTER — Other Ambulatory Visit (HOSPITAL_BASED_OUTPATIENT_CLINIC_OR_DEPARTMENT_OTHER): Payer: Self-pay | Admitting: Orthopaedic Surgery

## 2022-12-16 DIAGNOSIS — G8929 Other chronic pain: Secondary | ICD-10-CM

## 2022-12-17 ENCOUNTER — Ambulatory Visit
Admission: RE | Admit: 2022-12-17 | Discharge: 2022-12-17 | Disposition: A | Discharge status: Home / Self Care | Payer: 59 | Attending: Orthopaedic Surgery | Admitting: Orthopaedic Surgery

## 2022-12-17 ENCOUNTER — Ambulatory Visit
Admission: RE | Admit: 2022-12-17 | Discharge: 2022-12-17 | Disposition: A | Discharge status: Home / Self Care | Payer: 59 | Source: Ambulatory Visit | Attending: Orthopaedic Surgery | Admitting: Orthopaedic Surgery

## 2022-12-17 DIAGNOSIS — M25561 Pain in right knee: Secondary | ICD-10-CM | POA: Insufficient documentation

## 2022-12-17 DIAGNOSIS — G8929 Other chronic pain: Secondary | ICD-10-CM | POA: Insufficient documentation

## 2022-12-18 ENCOUNTER — Ambulatory Visit (INDEPENDENT_AMBULATORY_CARE_PROVIDER_SITE_OTHER): Payer: 59 | Admitting: Orthopaedic Surgery

## 2022-12-18 DIAGNOSIS — G8929 Other chronic pain: Secondary | ICD-10-CM

## 2022-12-18 DIAGNOSIS — M25561 Pain in right knee: Secondary | ICD-10-CM | POA: Diagnosis not present

## 2022-12-18 NOTE — Progress Notes (Signed)
Chief Complaint: Right knee pain     History of Present Illness:    Rachael Hoover is a 37 y.o. female presents today with lateral based right knee pain since June 2023.  She had her child in May of this year.  Since this time she is experiencing clicking and popping in the lateral aspect of the knee.  She has previously been seen at [facility] where they recommended ibuprofen and a gluteal strengthening program which she has trialed.  Denies any frank dislocations or patella subluxations.  The pain is located predominantly lateral and is with twisting motions.  She does enjoy being active although this has been very limited as result of the lateral pain.  She does enjoy riding horses for fun    Surgical History:   None  PMH/PSH/Family History/Social History/Meds/Allergies:    Past Medical History:  Diagnosis Date   Anxiety 2007   Bradycardia    Hypertension 2023   gestational   Infertility, female    Newborn product of in vitro fertilization (IVF) pregnancy    Past Surgical History:  Procedure Laterality Date   HERNIA REPAIR     when a small child - inguinal ?   ivf     TONSILLECTOMY     Social History   Socioeconomic History   Marital status: Married    Spouse name: Not on file   Number of children: Not on file   Years of education: Not on file   Highest education level: Not on file  Occupational History   Not on file  Tobacco Use   Smoking status: Never   Smokeless tobacco: Never  Vaping Use   Vaping status: Never Used  Substance and Sexual Activity   Alcohol use: Not Currently    Comment: few days a week   Drug use: No   Sexual activity: Yes    Birth control/protection: Pill  Other Topics Concern   Not on file  Social History Narrative   Not on file   Social Determinants of Health   Financial Resource Strain: Not on file  Food Insecurity: Not on file  Transportation Needs: Not on file  Physical Activity: Not on  file  Stress: Not on file  Social Connections: Not on file   Family History  Problem Relation Age of Onset   Anxiety disorder Mother    Arthritis Mother    Clotting disorder Mother    Hypertension Father    Arthritis Father    Thyroid cancer Maternal Aunt 57 - 69   Breast cancer Paternal Aunt 47 - 93   Anxiety disorder Maternal Grandmother    Breast cancer Paternal Grandmother    Hypertension Paternal Grandfather    Heart attack Paternal Grandfather 14 - 61   Allergies  Allergen Reactions   Hydrocodone-Acetaminophen Nausea And Vomiting    Patient states she takes tylenol by itself with no reaction "Violent"vomiting   Current Outpatient Medications  Medication Sig Dispense Refill   norethindrone (MICRONOR) 0.35 MG tablet Take 1 tablet by mouth daily.     Prenatal Vit-Fe Fumarate-FA (PRENATAL MULTIVITAMIN) TABS tablet Take 1 tablet by mouth daily at 12 noon.     sertraline (ZOLOFT) 50 MG tablet Take 1 tablet (50 mg total) by mouth daily. Take 50 mg by mouth daily. 90 tablet 1  No current facility-administered medications for this visit.   No results found.  Review of Systems:   A ROS was performed including pertinent positives and negatives as documented in the HPI.  Physical Exam :   Constitutional: NAD and appears stated age Neurological: Alert and oriented Psych: Appropriate affect and cooperative Last menstrual period 12/01/2022, not currently breastfeeding.   Comprehensive Musculoskeletal Exam:      Musculoskeletal Exam  Gait Normal  Alignment Normal   Right Left  Inspection Normal Normal  Palpation    Tenderness Lateral joint None  Crepitus None None  Effusion none none  Range of Motion    Extension -3 -3  Flexion 135 135  Strength    Extension 5/5 5/5  Flexion 5/5 5/5  Ligament Exam     Generalized Laxity No No  Lachman Negative Negative   Pivot Shift Negative Negative  Anterior Drawer Negative Negative  Valgus at 0 Negative Negative  Valgus at  20 Negative Negative  Varus at 0 0 0  Varus at 20   0 0  Posterior Drawer at 90 0 0  Vascular/Lymphatic Exam    Edema None None  Venous Stasis Changes No No  Distal Circulation Normal Normal  Neurologic    Light Touch Sensation Intact Intact  Special Tests: Positive McMurray laterally     Imaging:   Xray (4 views right knee): Normal  I personally reviewed and interpreted the radiographs.   Assessment:   37 y.o. female with evidence of a lateral meniscal injury.  At this time this is causing persistent popping and clicking as well as mechanical symptoms.  Given this I have recommended an MRI of the right knee.  She is already trialed activity restriction as well as anti-inflammatories without improvement.  Will plan for an MRI of the right knee and follow-up discuss results  Plan :    -Plan for MRI right knee and follow-up to discuss results     I personally saw and evaluated the patient, and participated in the management and treatment plan.  Huel Cote, MD Attending Physician, Orthopedic Surgery  This document was dictated using Dragon voice recognition software. A reasonable attempt at proof reading has been made to minimize errors.

## 2023-01-01 DIAGNOSIS — F4323 Adjustment disorder with mixed anxiety and depressed mood: Secondary | ICD-10-CM | POA: Diagnosis not present

## 2023-01-01 DIAGNOSIS — F411 Generalized anxiety disorder: Secondary | ICD-10-CM | POA: Diagnosis not present

## 2023-01-05 ENCOUNTER — Encounter: Payer: Self-pay | Admitting: Orthopaedic Surgery

## 2023-01-07 ENCOUNTER — Ambulatory Visit
Admission: RE | Admit: 2023-01-07 | Discharge: 2023-01-07 | Disposition: A | Discharge status: Home / Self Care | Payer: 59 | Source: Ambulatory Visit | Attending: Orthopaedic Surgery

## 2023-01-07 DIAGNOSIS — M23351 Other meniscus derangements, posterior horn of lateral meniscus, right knee: Secondary | ICD-10-CM | POA: Diagnosis not present

## 2023-01-07 DIAGNOSIS — M25561 Pain in right knee: Secondary | ICD-10-CM | POA: Diagnosis not present

## 2023-01-07 DIAGNOSIS — G8929 Other chronic pain: Secondary | ICD-10-CM

## 2023-01-22 ENCOUNTER — Ambulatory Visit: Payer: 59 | Admitting: Orthopaedic Surgery

## 2023-01-22 DIAGNOSIS — G8929 Other chronic pain: Secondary | ICD-10-CM | POA: Diagnosis not present

## 2023-01-22 DIAGNOSIS — S83261A Peripheral tear of lateral meniscus, current injury, right knee, initial encounter: Secondary | ICD-10-CM

## 2023-01-22 DIAGNOSIS — M25561 Pain in right knee: Secondary | ICD-10-CM

## 2023-01-22 NOTE — Progress Notes (Signed)
Chief Complaint: Right knee pain     History of Present Illness:   01/22/2023: Presents today with persistent lateral based symptoms with locking up as many times to 3 times a week.  She does state that this infection improvement from her baseline.  This has been improving somewhat with a home strengthening program  Rachael Hoover is a 37 y.o. female presents today with lateral based right knee pain since June 2023.  She had her child in May of this year.  Since this time she is experiencing clicking and popping in the lateral aspect of the knee.  She has previously been seen at [facility] where they recommended ibuprofen and a gluteal strengthening program which she has trialed.  Denies any frank dislocations or patella subluxations.  The pain is located predominantly lateral and is with twisting motions.  She does enjoy being active although this has been very limited as result of the lateral pain.  She does enjoy riding horses for fun    Surgical History:   None  PMH/PSH/Family History/Social History/Meds/Allergies:    Past Medical History:  Diagnosis Date   Anxiety 2007   Bradycardia    Hypertension 2023   gestational   Infertility, female    Newborn product of in vitro fertilization (IVF) pregnancy    Past Surgical History:  Procedure Laterality Date   HERNIA REPAIR     when a small child - inguinal ?   ivf     TONSILLECTOMY     Social History   Socioeconomic History   Marital status: Married    Spouse name: Not on file   Number of children: Not on file   Years of education: Not on file   Highest education level: Not on file  Occupational History   Not on file  Tobacco Use   Smoking status: Never   Smokeless tobacco: Never  Vaping Use   Vaping status: Never Used  Substance and Sexual Activity   Alcohol use: Not Currently    Comment: few days a week   Drug use: No   Sexual activity: Yes    Birth control/protection: Pill   Other Topics Concern   Not on file  Social History Narrative   Not on file   Social Determinants of Health   Financial Resource Strain: Not on file  Food Insecurity: Not on file  Transportation Needs: Not on file  Physical Activity: Not on file  Stress: Not on file  Social Connections: Not on file   Family History  Problem Relation Age of Onset   Anxiety disorder Mother    Arthritis Mother    Clotting disorder Mother    Hypertension Father    Arthritis Father    Thyroid cancer Maternal Aunt 34 - 69   Breast cancer Paternal Aunt 47 - 51   Anxiety disorder Maternal Grandmother    Breast cancer Paternal Grandmother    Hypertension Paternal Grandfather    Heart attack Paternal Grandfather 8 - 45   Allergies  Allergen Reactions   Hydrocodone-Acetaminophen Nausea And Vomiting    Patient states she takes tylenol by itself with no reaction "Violent"vomiting   Current Outpatient Medications  Medication Sig Dispense Refill   norethindrone (MICRONOR) 0.35 MG tablet Take 1 tablet by mouth daily.     Prenatal Vit-Fe Fumarate-FA (PRENATAL  MULTIVITAMIN) TABS tablet Take 1 tablet by mouth daily at 12 noon.     sertraline (ZOLOFT) 50 MG tablet Take 1 tablet (50 mg total) by mouth daily. Take 50 mg by mouth daily. 90 tablet 1   No current facility-administered medications for this visit.   No results found.  Review of Systems:   A ROS was performed including pertinent positives and negatives as documented in the HPI.  Physical Exam :   Constitutional: NAD and appears stated age Neurological: Alert and oriented Psych: Appropriate affect and cooperative not currently breastfeeding.   Comprehensive Musculoskeletal Exam:      Musculoskeletal Exam  Gait Normal  Alignment Normal   Right Left  Inspection Normal Normal  Palpation    Tenderness Lateral joint None  Crepitus None None  Effusion none none  Range of Motion    Extension -3 -3  Flexion 135 135  Strength     Extension 5/5 5/5  Flexion 5/5 5/5  Ligament Exam     Generalized Laxity No No  Lachman Negative Negative   Pivot Shift Negative Negative  Anterior Drawer Negative Negative  Valgus at 0 Negative Negative  Valgus at 20 Negative Negative  Varus at 0 0 0  Varus at 20   0 0  Posterior Drawer at 90 0 0  Vascular/Lymphatic Exam    Edema None None  Venous Stasis Changes No No  Distal Circulation Normal Normal  Neurologic    Light Touch Sensation Intact Intact  Special Tests: Positive McMurray laterally     Imaging:   Xray (4 views right knee): Normal  MRI right knee: Complex degenerative tear of the lateral meniscus  I personally reviewed and interpreted the radiographs.   Assessment:   37 y.o. female with evidence of a lateral meniscal injury.  At this time this is causing persistent popping and clicking as well as mechanical symptoms.  At this point she states that her symptoms are improved but she is walking up is much as 3 times a week.  She is currently in the process of moving homes.  I did discuss that she does have a lateral meniscal tear that could be recurrently symptomatic.  We did discuss the role for possible arthroscopic repair.  I did discuss risks limitations as well as the associated rehab time.  At this time she will continue working on home exercises and she will send [place] a message should she want to consider lateral meniscal repair  Plan :    -Plan for MRI right knee and follow-up to discuss results     I personally saw and evaluated the patient, and participated in the management and treatment plan.  Huel Cote, MD Attending Physician, Orthopedic Surgery  This document was dictated using Dragon voice recognition software. A reasonable attempt at proof reading has been made to minimize errors.

## 2023-02-25 ENCOUNTER — Other Ambulatory Visit: Payer: Self-pay | Admitting: Physician Assistant

## 2023-02-25 DIAGNOSIS — R5383 Other fatigue: Secondary | ICD-10-CM

## 2023-02-25 DIAGNOSIS — F419 Anxiety disorder, unspecified: Secondary | ICD-10-CM

## 2023-02-26 NOTE — Telephone Encounter (Signed)
Requested Prescriptions  Pending Prescriptions Disp Refills   sertraline (ZOLOFT) 50 MG tablet [Pharmacy Med Name: SERTRALINE HCL 50 MG TABLET] 90 tablet 0    Sig: TAKE 1 TABLET (50 MG TOTAL) BY MOUTH DAILY. TAKE 50 MG BY MOUTH DAILY.     Psychiatry:  Antidepressants - SSRI - sertraline Passed - 02/25/2023  2:26 AM      Passed - AST in normal range and within 360 days    AST  Date Value Ref Range Status  08/19/2022 19 0 - 40 IU/L Final         Passed - ALT in normal range and within 360 days    ALT  Date Value Ref Range Status  08/19/2022 15 0 - 32 IU/L Final         Passed - Completed PHQ-2 or PHQ-9 in the last 360 days      Passed - Valid encounter within last 6 months    Recent Outpatient Visits           3 months ago Pain in lateral portion of right knee   [facility] [place], [place], PA-C   6 months ago Other fatigue   [facility] [place], [place], PA-C       Future Appointments             In 5 months Ostwalt, Edmon Crape, PA-C [facility]

## 2023-04-27 ENCOUNTER — Encounter: Payer: Self-pay | Admitting: Physician Assistant

## 2023-04-27 DIAGNOSIS — R112 Nausea with vomiting, unspecified: Secondary | ICD-10-CM | POA: Diagnosis not present

## 2023-04-27 DIAGNOSIS — Z6828 Body mass index (BMI) 28.0-28.9, adult: Secondary | ICD-10-CM | POA: Diagnosis not present

## 2023-04-27 DIAGNOSIS — R197 Diarrhea, unspecified: Secondary | ICD-10-CM | POA: Diagnosis not present

## 2023-06-12 ENCOUNTER — Other Ambulatory Visit: Payer: Self-pay | Admitting: Physician Assistant

## 2023-06-12 DIAGNOSIS — R5383 Other fatigue: Secondary | ICD-10-CM

## 2023-06-12 DIAGNOSIS — F419 Anxiety disorder, unspecified: Secondary | ICD-10-CM

## 2023-06-15 NOTE — Telephone Encounter (Signed)
 Requested Prescriptions  Pending Prescriptions Disp Refills   sertraline (ZOLOFT) 50 MG tablet [Pharmacy Med Name: SERTRALINE HCL 50 MG TABLET] 90 tablet 0    Sig: TAKE 1 TABLET (50 MG TOTAL) BY MOUTH DAILY. TAKE 50 MG BY MOUTH DAILY.     Psychiatry:  Antidepressants - SSRI - sertraline Failed - 06/15/2023  8:03 AM      Failed - Valid encounter within last 6 months    Recent Outpatient Visits           6 months ago Pain in lateral portion of right knee   [facility] [place], [place], PA-C   10 months ago Other fatigue   [facility] [place], [place], PA-C       Future Appointments             In 2 months Ostwalt, [place], PA-C [facility]            Passed - AST in normal range and within 360 days    AST  Date Value Ref Range Status  08/19/2022 19 0 - 40 IU/L Final         Passed - ALT in normal range and within 360 days    ALT  Date Value Ref Range Status  08/19/2022 15 0 - 32 IU/L Final         Passed - Completed PHQ-2 or PHQ-9 in the last 360 days

## 2023-08-21 ENCOUNTER — Encounter: Payer: Self-pay | Admitting: Physician Assistant

## 2023-08-21 ENCOUNTER — Ambulatory Visit (INDEPENDENT_AMBULATORY_CARE_PROVIDER_SITE_OTHER): Payer: Self-pay | Admitting: Physician Assistant

## 2023-08-21 VITALS — BP 121/89 | HR 71 | Ht 68.0 in | Wt 200.4 lb

## 2023-08-21 DIAGNOSIS — E669 Obesity, unspecified: Secondary | ICD-10-CM | POA: Diagnosis not present

## 2023-08-21 DIAGNOSIS — F419 Anxiety disorder, unspecified: Secondary | ICD-10-CM | POA: Diagnosis not present

## 2023-08-21 DIAGNOSIS — Z0000 Encounter for general adult medical examination without abnormal findings: Secondary | ICD-10-CM | POA: Diagnosis not present

## 2023-08-21 NOTE — Progress Notes (Unsigned)
 Complete physical exam  Patient: Rachael Hoover   DOB: 1985-09-14   38 y.o. Female  MRN: [id number] Visit Date: 08/21/2023  Today's healthcare provider: Blane Bunting, PA-C   Chief Complaint  Patient presents with   Annual Exam    Diet -  General, unhealthy Exercise - running 10 miles a week over a 4 day time frame, also reports riding horses once a week  Feeling - well Sleeping - well Concerns - completing physical paperwork for her job that she provided today   Subjective    Rachael Hoover is a 38 y.o. female who presents today for a complete physical exam.  She reports consuming a {diet types:17450} diet. {Exercise:19826} She generally feels {well/fairly well/poorly:18703}. She reports sleeping {well/fairly well/poorly:18703}. She {does/does not:200015} have additional problems to discuss today.  HPI HPI     Annual Exam    Additional comments: Diet -  General, unhealthy Exercise - running 10 miles a week over a 4 day time frame, also reports riding horses once a week  Feeling - well Sleeping - well Concerns - completing physical paperwork for her job that she provided today      Last edited by Pasty Bongo, CMA on 08/21/2023  8:43 AM.      *** Discussed the use of AI scribe software for clinical note transcription with the patient, who gave verbal consent to proceed.  History of Present Illness Rachael Hoover is a 38 year old female who presents for an annual physical exam.  She has experienced a swollen gland for a prolonged period, possibly related to a cold or allergies from a month ago. The swelling has improved compared to last week. She had post-nasal drip a couple of weeks ago and experiences sinus issues, particularly on one side.  She experiences foot pain, likely plantar fasciitis, due to her weight and increased running activity. The pain is most noticeable with the first steps in the morning but improves with activity.  She has been on Zoloft  for  anxiety for almost two years, which she finds significantly beneficial. Anxiety is present in her family history, affecting her mother and grandmother.  She expresses concerns about her weight, attributing issues to poor diet choices and a busy lifestyle. She has resumed running and purchased a treadmill, which she enjoys and finds beneficial.  She has not had an eye exam since 2016 but has an upcoming appointment this month. She has noticed a decline in her vision, particularly for distance, and has a family history of eye problems, including her mother's degenerative retina issues.    Last depression screening scores    08/21/2023    8:45 AM 11/20/2022    8:16 AM 08/19/2022    9:09 AM  PHQ 2/9 Scores  PHQ - 2 Score 0 1 0  PHQ- 9 Score 3 5 5    Last fall risk screening    08/21/2023    8:45 AM  Fall Risk   Falls in the past year? 1  Number falls in past yr: 0  Injury with Fall? 0  Risk for fall due to : No Fall Risks  Follow up Falls evaluation completed   Last Audit-C alcohol use screening    08/20/2023    8:52 AM  Alcohol Use Disorder Test (AUDIT)  1. How often do you have a drink containing alcohol? 4  2. How many drinks containing alcohol do you have on a typical day when you are drinking? 0  3. How often do you have six or more drinks on one occasion? 1  AUDIT-C Score 5   4. How often during the last year have you found that you were not able to stop drinking once you had started? 0  5. How often during the last year have you failed to do what was normally expected from you because of drinking? 0  6. How often during the last year have you needed a first drink in the morning to get yourself going after a heavy drinking session? 0  7. How often during the last year have you had a feeling of guilt of remorse after drinking? 0  8. How often during the last year have you been unable to remember what happened the night before because you had been drinking? 0  9. Have you or someone  else been injured as a result of your drinking? 0  10. Has a relative or friend or a doctor or another health worker been concerned about your drinking or suggested you cut down? 0  Alcohol Use Disorder Identification Test Final Score (AUDIT) 5      Patient-reported   A score of 3 or more in women, and 4 or more in men indicates increased risk for alcohol abuse, EXCEPT if all of the points are from question 1   Past Medical History:  Diagnosis Date   Anxiety 2007   Bradycardia    Hypertension 2023   gestational   Infertility, female    Newborn product of in vitro fertilization (IVF) pregnancy    Past Surgical History:  Procedure Laterality Date   HERNIA REPAIR     when a small child - inguinal ?   ivf     TONSILLECTOMY     Social History   Socioeconomic History   Marital status: Married    Spouse name: Not on file   Number of children: Not on file   Years of education: Not on file   Highest education level: Master's degree (e.g., MA, MS, MEng, MEd, MSW, MBA)  Occupational History   Not on file  Tobacco Use   Smoking status: Never   Smokeless tobacco: Never  Vaping Use   Vaping status: Never Used  Substance and Sexual Activity   Alcohol use: Not Currently    Alcohol/week: 8.0 standard drinks of alcohol    Types: 8 Glasses of wine per week    Comment: few days a week   Drug use: No   Sexual activity: Yes    Birth control/protection: Pill  Other Topics Concern   Not on file  Social History Narrative   Not on file   Social Drivers of Health   Financial Resource Strain: Low Risk  (08/20/2023)   Overall Financial Resource Strain (CARDIA)    Difficulty of Paying Living Expenses: Not very hard  Food Insecurity: No Food Insecurity (08/20/2023)   Hunger Vital Sign    Worried About Running Out of Food in the Last Year: Never true    Ran Out of Food in the Last Year: Never true  Transportation Needs: No Transportation Needs (08/20/2023)   PRAPARE - Doctor, general practice (Medical): No    Lack of Transportation (Non-Medical): No  Physical Activity: Sufficiently Active (08/20/2023)   Exercise Vital Sign    Days of Exercise per Week: 4 days    Minutes of Exercise per Session: 120 min  Stress: No Stress Concern Present (08/20/2023)   [employer] of Occupational  Health - Occupational Stress Questionnaire    Feeling of Stress : Only a little  Social Connections: Socially Isolated (08/20/2023)   Social Connection and Isolation Panel [NHANES]    Frequency of Communication with Friends and Family: Never    Frequency of Social Gatherings with Friends and Family: Twice a week    Attends Religious Services: Never    Database administrator or Organizations: No    Attends Engineer, structural: Not on file    Marital Status: Married  Catering manager Violence: Not At Risk (08/21/2023)   Humiliation, Afraid, Rape, and Kick questionnaire    Fear of Current or Ex-Partner: No    Emotionally Abused: No    Physically Abused: No    Sexually Abused: No   Family Status  Relation Name Status   Mother Mother Alive   Father Father Alive   Mat Aunt  (Not Specified)   Pat Aunt  Deceased   MGM El Deceased   MGF  Deceased   PGM  Deceased   PGF ? Deceased  No partnership data on file   Family History  Problem Relation Age of Onset   Anxiety disorder Mother    Arthritis Mother    Clotting disorder Mother    Hypertension Father    Arthritis Father    Thyroid  cancer Maternal Aunt 23 - 1   Breast cancer Paternal Aunt 13 - 61   Anxiety disorder Maternal Grandmother    Breast cancer Paternal Grandmother    Hypertension Paternal Grandfather    Heart attack Paternal Grandfather 8 - 48   Allergies  Allergen Reactions   Hydrocodone-Acetaminophen  Nausea And Vomiting    Patient states she takes tylenol  by itself with no reaction "Violent"vomiting    Patient Care Team: Anaiza Behrens, PA-C as PCP - General (Physician Assistant) Rogene Claude, MD as Consulting Physician (Obstetrics and Gynecology)   Medications: Outpatient Medications Prior to Visit  Medication Sig   norethindrone (MICRONOR) 0.35 MG tablet Take 1 tablet by mouth daily.   sertraline  (ZOLOFT ) 50 MG tablet TAKE 1 TABLET (50 MG TOTAL) BY MOUTH DAILY. TAKE 50 MG BY MOUTH DAILY.   Prenatal Vit-Fe Fumarate-FA (PRENATAL MULTIVITAMIN) TABS tablet Take 1 tablet by mouth daily at 12 noon.   No facility-administered medications prior to visit.    Review of Systems Except see HPI  {Insert previous labs (optional):23779} {See past labs  Heme  Chem  Endocrine  Serology  Results Review (optional):1}  Objective    BP 121/89 (BP Location: Left Arm, Patient Position: Sitting, Cuff Size: Normal)   Pulse 71   Ht 5\' 8"  (1.727 m)   Wt 200 lb 6.4 oz (90.9 kg)   SpO2 100%   BMI 30.47 kg/m  {Insert last BP/Wt (optional):23777}{See vitals history (optional):1}    Physical Exam   No results found for any visits on 08/21/23.  Assessment & Plan    Routine Health Maintenance and Physical Exam  Exercise Activities and Dietary recommendations  Goals   None     Immunization History  Administered Date(s) Administered   Influenza,inj,Quad PF,6+ Mos 01/14/2019   Influenza,inj,Quad PF,6-35 Mos 02/06/2022   Moderna Covid-19 Vaccine Bivalent Booster 74yrs & up 02/16/2021   Moderna Sars-Covid-2 Vaccination 03/19/2020   PFIZER(Purple Top)SARS-COV-2 Vaccination 06/23/2019, 07/14/2019   Tdap 06/18/2021    Health Maintenance  Topic Date Due   COVID-19 Vaccine (5 - 2024-25 season) 12/14/2022   Flu Shot  11/13/2023   Pap with HPV screening  01/26/2025  DTaP/Tdap/Td vaccine (2 - Td or Tdap) 06/19/2031   Hepatitis C Screening  Completed   HIV Screening  Completed   HPV Vaccine  Aged Out   Meningitis B Vaccine  Aged Out    Discussed health benefits of physical activity, and encouraged her to engage in regular exercise appropriate for her age and  condition.  Assessment and Plan Assessment & Plan Plantar fasciitis Attributed to increased running and weight. Prefers conservative management. - Encourage weight loss. - Recommend stretching exercises. - Consider taping and orthotics if symptoms persist.  Torn meniscus Symptoms improved over 6-8 months. Surgery advised but managing with protective movements. - Consider surgical intervention. - Continue protective movements.  Anxiety disorder Well-managed with Zoloft . Discussed medication adherence and long-term use. - Continue Zoloft . - Ensure timely refills. - Encourage regular therapy sessions. - Reassess periodically.  Swollen gland Persistent post-cold swelling, possibly sinus-related. Improved but still present. - Apply warm compresses. - Monitor for resolution.  Wellness Visit Discussed diet, exercise, and weight management. Encouraged healthy eating and regular exams. - Encourage healthy eating. - Discuss dietary options such as Whole30. - Continue regular exercise. - Schedule annual physical exam.     ***  Return in about 1 year (around 08/20/2024) for CPE.    The patient was advised to call back or seek an in-person evaluation if the symptoms worsen or if the condition fails to improve as anticipated.  I discussed the assessment and treatment plan with the patient. The patient was provided an opportunity to ask questions and all were answered. The patient agreed with the plan and demonstrated an understanding of the instructions.  I, Elleana Stillson, PA-C have reviewed all documentation for this visit. The documentation on 08/21/2023  for the exam, diagnosis, procedures, and orders are all accurate and complete.  Blane Bunting, [facility], MMS [facility] [phone] (phone) [phone] (fax)  [facility]

## 2023-08-22 LAB — COMPREHENSIVE METABOLIC PANEL WITH GFR
ALT: 11 IU/L (ref 0–32)
AST: 16 IU/L (ref 0–40)
Albumin: 4.6 g/dL (ref 3.9–4.9)
Alkaline Phosphatase: 59 IU/L (ref 44–121)
BUN/Creatinine Ratio: 15 (ref 9–23)
BUN: 14 mg/dL (ref 6–20)
Bilirubin Total: 0.5 mg/dL (ref 0.0–1.2)
CO2: 21 mmol/L (ref 20–29)
Calcium: 9.5 mg/dL (ref 8.7–10.2)
Chloride: 102 mmol/L (ref 96–106)
Creatinine, Ser: 0.91 mg/dL (ref 0.57–1.00)
Globulin, Total: 2.1 g/dL (ref 1.5–4.5)
Glucose: 80 mg/dL (ref 70–99)
Potassium: 4.5 mmol/L (ref 3.5–5.2)
Sodium: 138 mmol/L (ref 134–144)
Total Protein: 6.7 g/dL (ref 6.0–8.5)
eGFR: 82 mL/min/{1.73_m2} (ref >59)

## 2023-08-22 LAB — LIPID PANEL WITH LDL/HDL RATIO
Cholesterol, Total: 184 mg/dL (ref 100–199)
HDL: 77 mg/dL (ref >39)
LDL Chol Calc (NIH): 95 mg/dL (ref 0–99)
LDL/HDL Ratio: 1.2 ratio (ref 0.0–3.2)
Triglycerides: 64 mg/dL (ref 0–149)
VLDL Cholesterol Cal: 12 mg/dL (ref 5–40)

## 2023-08-22 LAB — HEMOGLOBIN A1C
Est. average glucose Bld gHb Est-mCnc: 105 mg/dL
Hgb A1c MFr Bld: 5.3 % (ref 4.8–5.6)

## 2023-08-24 ENCOUNTER — Encounter: Payer: Self-pay | Admitting: Physician Assistant

## 2023-08-25 DIAGNOSIS — H5213 Myopia, bilateral: Secondary | ICD-10-CM | POA: Diagnosis not present

## 2023-09-13 ENCOUNTER — Other Ambulatory Visit: Payer: Self-pay | Admitting: Physician Assistant

## 2023-09-13 DIAGNOSIS — F419 Anxiety disorder, unspecified: Secondary | ICD-10-CM

## 2023-09-13 DIAGNOSIS — R5383 Other fatigue: Secondary | ICD-10-CM

## 2023-09-17 DIAGNOSIS — L918 Other hypertrophic disorders of the skin: Secondary | ICD-10-CM | POA: Diagnosis not present

## 2023-09-17 DIAGNOSIS — Z309 Encounter for contraceptive management, unspecified: Secondary | ICD-10-CM | POA: Diagnosis not present

## 2023-09-17 DIAGNOSIS — Z1389 Encounter for screening for other disorder: Secondary | ICD-10-CM | POA: Diagnosis not present

## 2023-09-17 DIAGNOSIS — Z01419 Encounter for gynecological examination (general) (routine) without abnormal findings: Secondary | ICD-10-CM | POA: Diagnosis not present

## 2023-09-17 DIAGNOSIS — N946 Dysmenorrhea, unspecified: Secondary | ICD-10-CM | POA: Diagnosis not present

## 2023-10-12 DIAGNOSIS — L918 Other hypertrophic disorders of the skin: Secondary | ICD-10-CM | POA: Diagnosis not present

## 2023-11-04 DIAGNOSIS — F439 Reaction to severe stress, unspecified: Secondary | ICD-10-CM | POA: Diagnosis not present

## 2023-11-04 DIAGNOSIS — F411 Generalized anxiety disorder: Secondary | ICD-10-CM | POA: Diagnosis not present

## 2023-11-12 DIAGNOSIS — F439 Reaction to severe stress, unspecified: Secondary | ICD-10-CM | POA: Diagnosis not present

## 2023-11-12 DIAGNOSIS — F411 Generalized anxiety disorder: Secondary | ICD-10-CM | POA: Diagnosis not present

## 2023-11-17 DIAGNOSIS — F439 Reaction to severe stress, unspecified: Secondary | ICD-10-CM | POA: Diagnosis not present

## 2023-11-17 DIAGNOSIS — F411 Generalized anxiety disorder: Secondary | ICD-10-CM | POA: Diagnosis not present

## 2023-11-24 DIAGNOSIS — F439 Reaction to severe stress, unspecified: Secondary | ICD-10-CM | POA: Diagnosis not present

## 2023-11-24 DIAGNOSIS — F411 Generalized anxiety disorder: Secondary | ICD-10-CM | POA: Diagnosis not present

## 2023-11-25 IMAGING — DX DG CHEST 1V PORT
1 series · 1 of 1 positions shown · non-contrast
Comparison: None Available.

CLINICAL DATA: Pt having low blood pressure, bradycardia, tightness
in chest and ribs all today - recent childbirth [phone] [id number]

EXAM:
PORTABLE CHEST 1 VIEW

[chest ap]
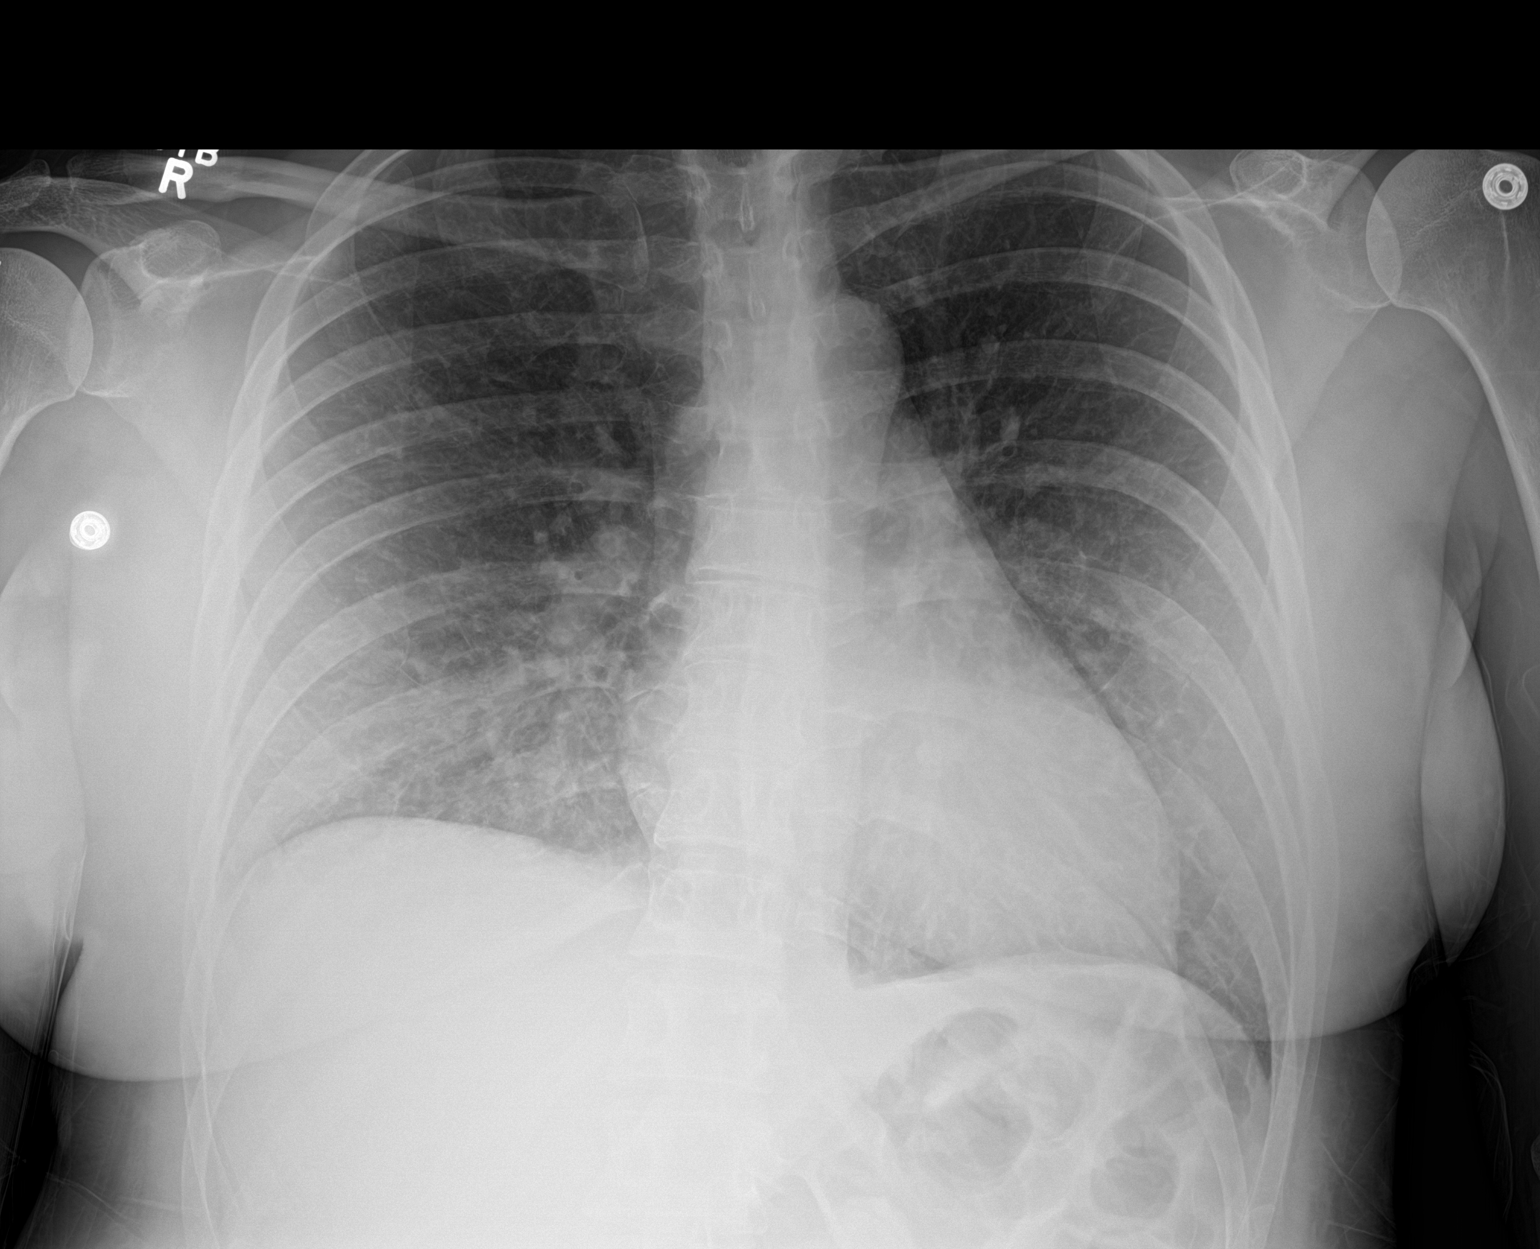

[1 of 1 positions shown; findings below may reference images not displayed]

FINDINGS: Normal cardiac silhouette. Hazy density in lower lobes. No focal
consolidation. No pleural fluid. No pneumothorax.
IMPRESSION: Hazy density lower lobes could represent mild edema or atelectasis.

## 2023-11-25 IMAGING — CT CT ANGIO CHEST
2 of 6 series · 19 of 46 positions shown · IV contrast (agent unspecified)
Comparison: None Available.

CLINICAL DATA: Pulmonary embolism (PE) suspected, high prob

EXAM:
CT ANGIOGRAPHY CHEST WITH CONTRAST
TECHNIQUE: Multidetector CT imaging of the chest was performed using the
standard protocol during bolus administration of intravenous
contrast. Multiplanar CT image reconstructions and MIPs were
obtained to evaluate the vascular anatomy.

[Series 6: thins · axial · 0.76mm/px · z∈[-265,-10]mm · 16 of 281 slices shown]
[im 13/281  lung]
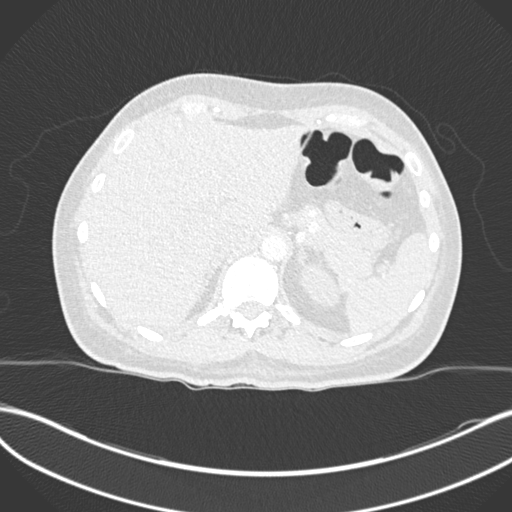
[im 37/281  soft-tissue]
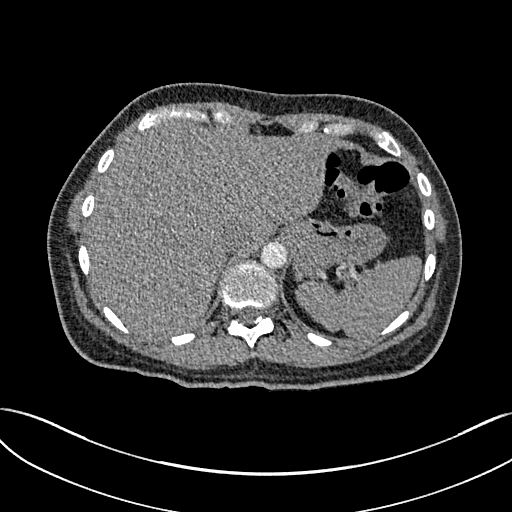
[im 49/281  lung]
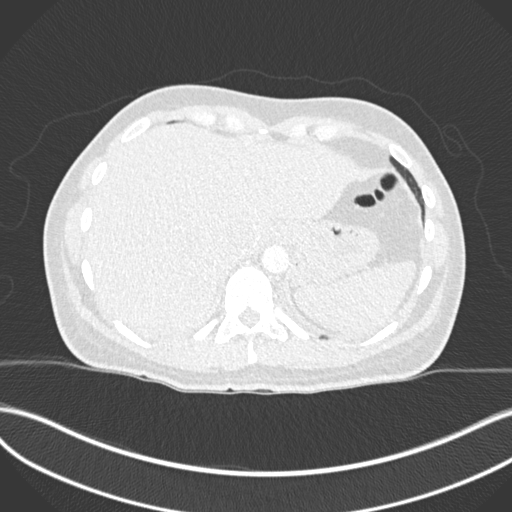
[im 61/281  soft-tissue]
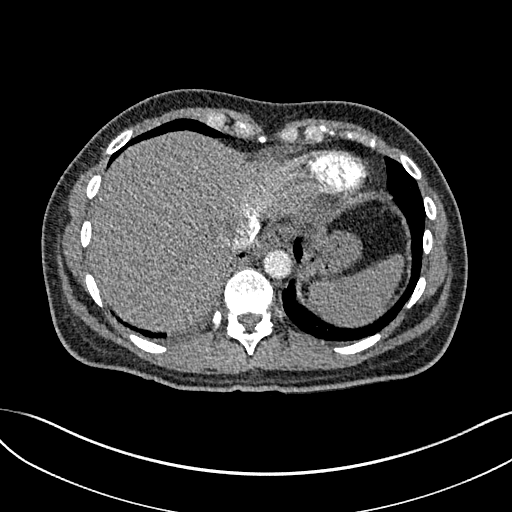
[im 86/281  lung]
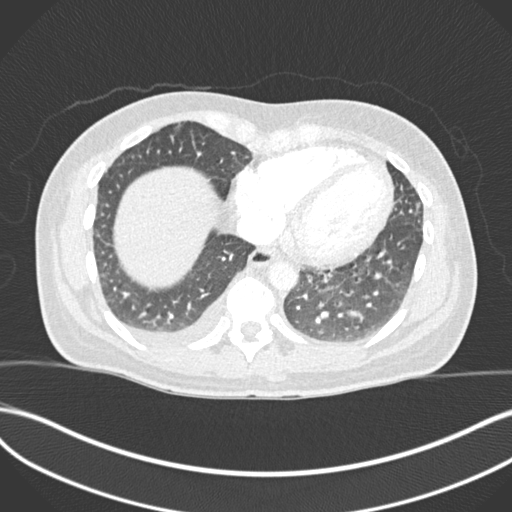
[im 98/281  soft-tissue]
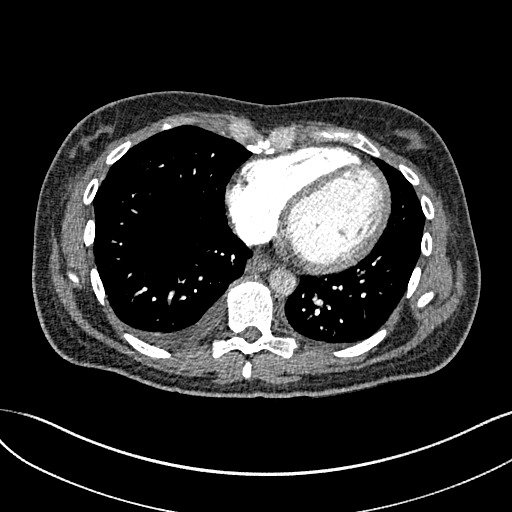
[im 110/281  lung]
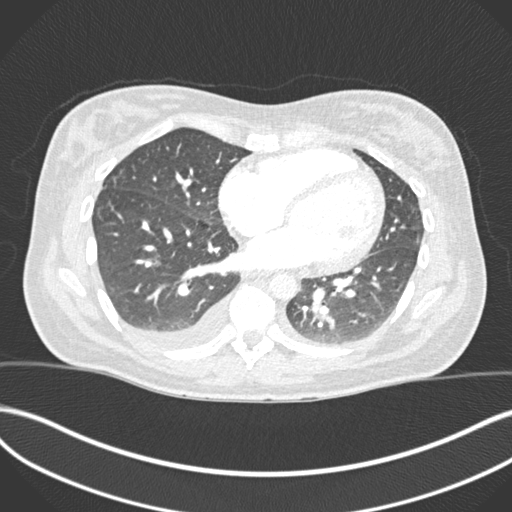
[im 134/281  soft-tissue]
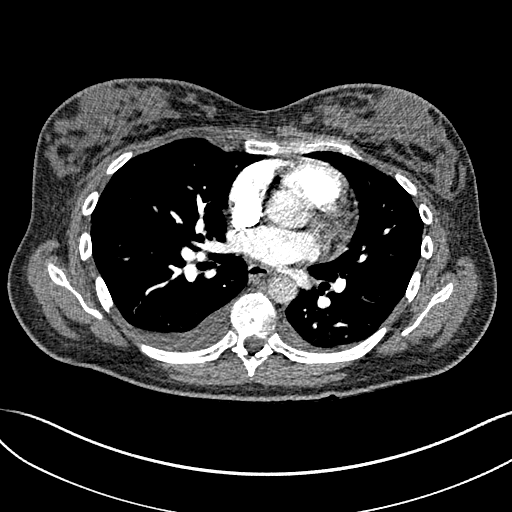
[im 147/281  lung]
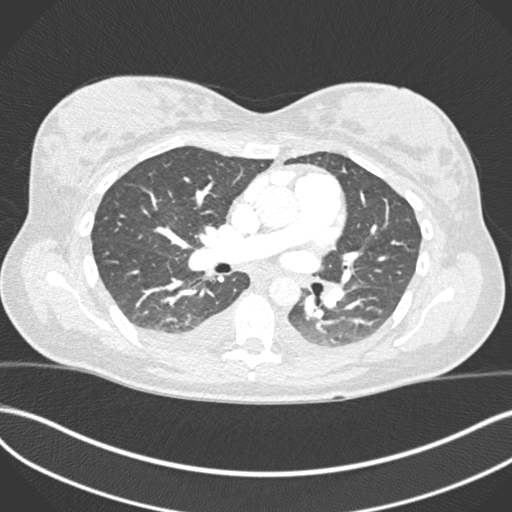
[im 171/281  soft-tissue]
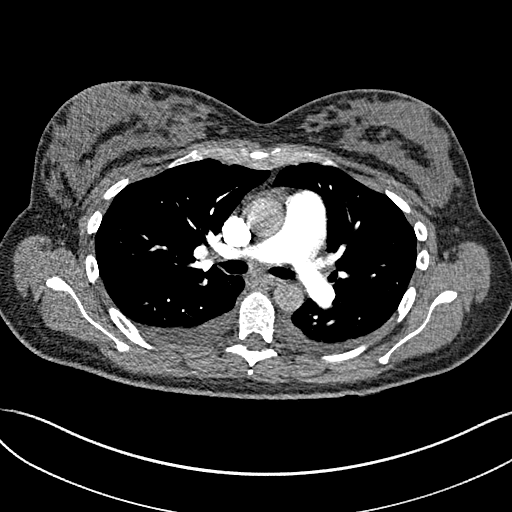
[im 183/281  lung]
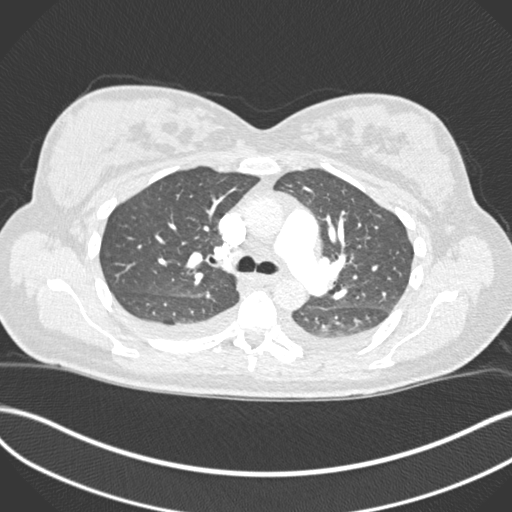
[im 195/281  soft-tissue]
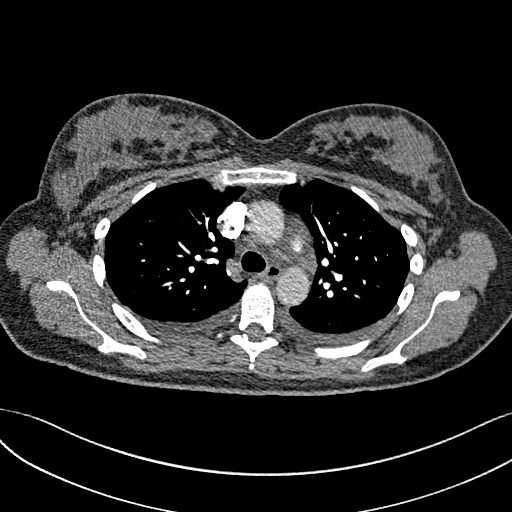
[im 220/281  lung]
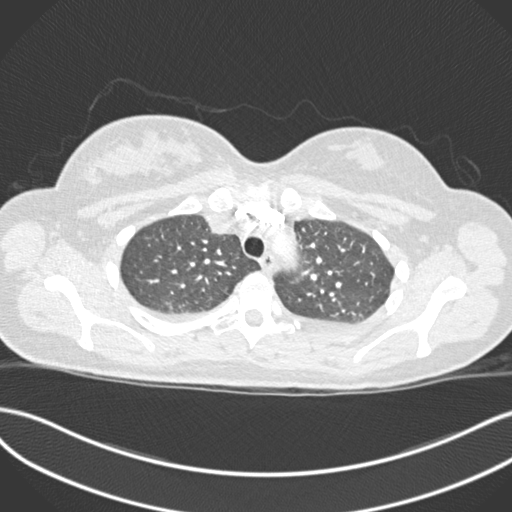
[im 232/281  soft-tissue]
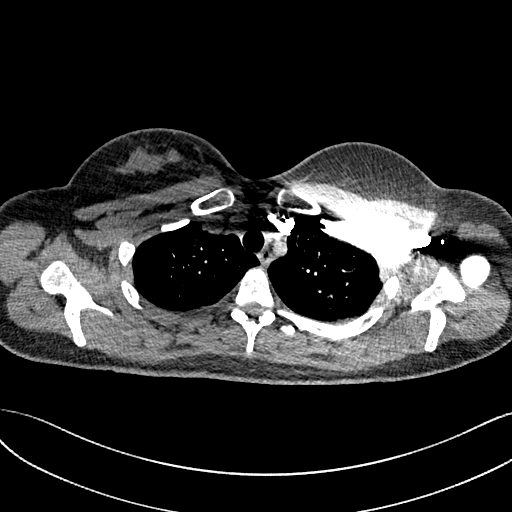
[im 244/281  lung]
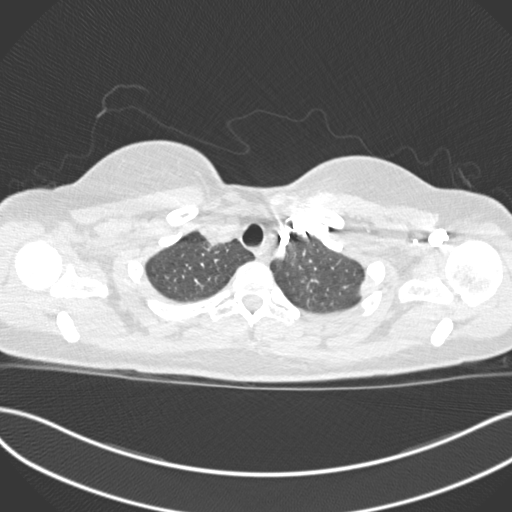
[im 268/281  soft-tissue]
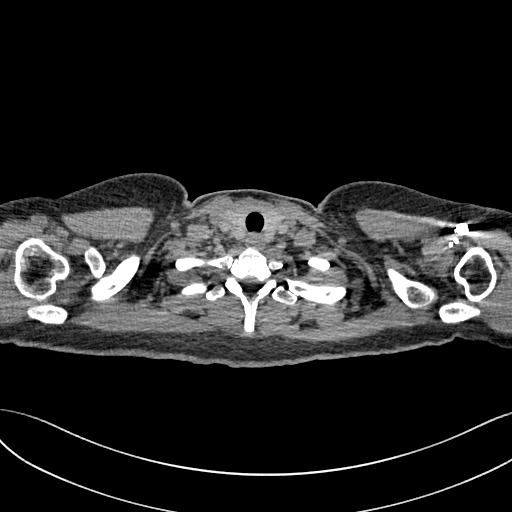

[Series 8: coronal mpr · coronal · 0.59mm/px · 3 of 148 slices shown]
[im 37/148  soft-tissue]
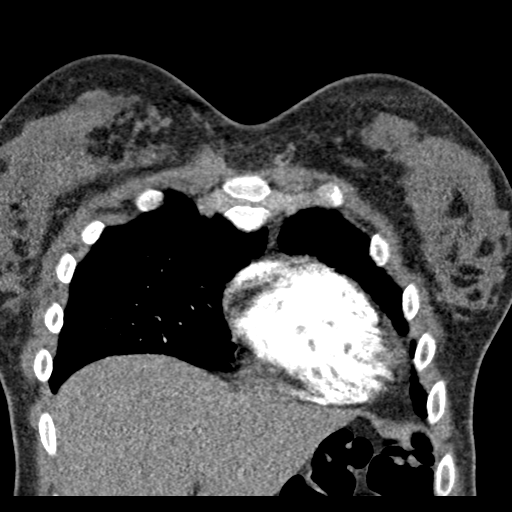
[im 74/148  soft-tissue]
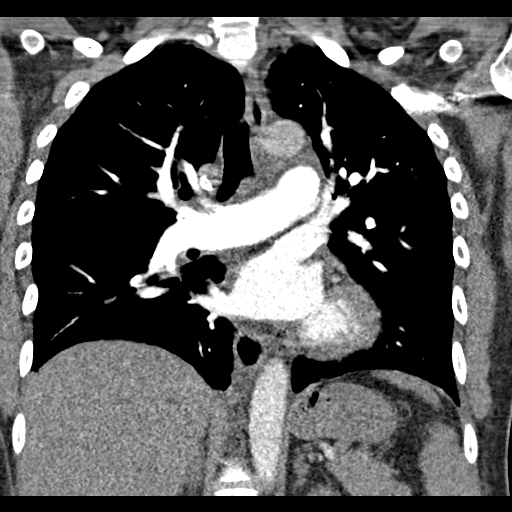
[im 111/148  soft-tissue]
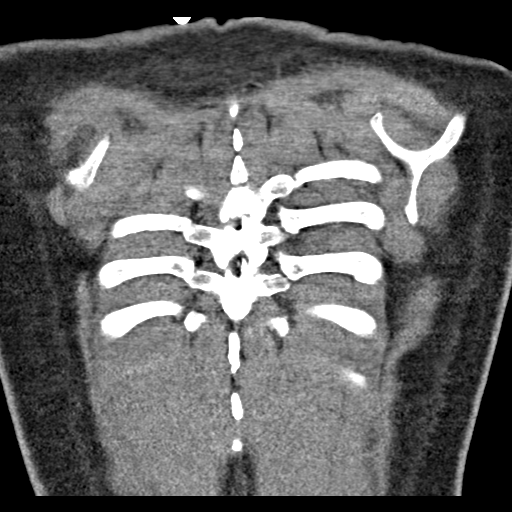

[19 of 46 positions shown; findings below may reference images not displayed]

RADIATION DOSE REDUCTION: This exam was performed according to the
departmental dose-optimization program which includes automated
exposure control, adjustment of the mA and/or kV according to
patient size and/or use of iterative reconstruction technique.

CONTRAST:  80mL OMNIPAQUE IOHEXOL 350 MG/ML SOLN
FINDINGS: Cardiovascular: Satisfactory opacification of the pulmonary arteries
to the segmental level. No evidence of pulmonary embolism. Mild
cardiomegaly with enlarged left ventricle, right ventricle, and
right atrium.No pericardial disease. The thoracic aorta is
unremarkable.

Mediastinum/Nodes: Mildly prominent mediastinal nodes, likely
reactive. No hilar or axillary lymphadenopathy. The thyroid is
unremarkable. Esophagus is unremarkable.

Lungs/Pleura: Small bilateral pleural effusions, right greater than
left. There is mild interlobular septal thickening in the lung
bases. No pneumothorax. No focal airspace consolidation. No
suspicious pulmonary nodules

Upper Abdomen: No acute abnormality.

Musculoskeletal: No chest wall abnormality. No acute or significant
osseous findings.

Review of the MIP images confirms the above findings.
IMPRESSION: Mild cardiomegaly with interstitial edema and small bilateral
pleural effusions, right greater than left.

No evidence of pulmonary embolism.

## 2023-12-09 ENCOUNTER — Other Ambulatory Visit: Payer: Self-pay | Admitting: Physician Assistant

## 2023-12-09 DIAGNOSIS — R5383 Other fatigue: Secondary | ICD-10-CM

## 2023-12-09 DIAGNOSIS — F419 Anxiety disorder, unspecified: Secondary | ICD-10-CM

## 2023-12-11 DIAGNOSIS — F411 Generalized anxiety disorder: Secondary | ICD-10-CM | POA: Diagnosis not present

## 2023-12-11 DIAGNOSIS — F439 Reaction to severe stress, unspecified: Secondary | ICD-10-CM | POA: Diagnosis not present

## 2023-12-30 DIAGNOSIS — F439 Reaction to severe stress, unspecified: Secondary | ICD-10-CM | POA: Diagnosis not present

## 2023-12-30 DIAGNOSIS — F411 Generalized anxiety disorder: Secondary | ICD-10-CM | POA: Diagnosis not present

## 2024-01-22 DIAGNOSIS — F411 Generalized anxiety disorder: Secondary | ICD-10-CM | POA: Diagnosis not present

## 2024-01-22 DIAGNOSIS — F439 Reaction to severe stress, unspecified: Secondary | ICD-10-CM | POA: Diagnosis not present

## 2024-02-05 DIAGNOSIS — F411 Generalized anxiety disorder: Secondary | ICD-10-CM | POA: Diagnosis not present

## 2024-02-05 DIAGNOSIS — F439 Reaction to severe stress, unspecified: Secondary | ICD-10-CM | POA: Diagnosis not present

## 2024-02-25 DIAGNOSIS — F411 Generalized anxiety disorder: Secondary | ICD-10-CM | POA: Diagnosis not present

## 2024-02-25 DIAGNOSIS — F439 Reaction to severe stress, unspecified: Secondary | ICD-10-CM | POA: Diagnosis not present

## 2024-04-10 ENCOUNTER — Telehealth: Admitting: Physician Assistant

## 2024-04-10 DIAGNOSIS — J208 Acute bronchitis due to other specified organisms: Secondary | ICD-10-CM

## 2024-04-10 DIAGNOSIS — B9689 Other specified bacterial agents as the cause of diseases classified elsewhere: Secondary | ICD-10-CM | POA: Diagnosis not present

## 2024-04-11 MED ORDER — AZITHROMYCIN 250 MG PO TABS: ORAL_TABLET | ORAL | 0 refills | Status: AC

## 2024-04-11 MED ORDER — BENZONATATE 100 MG PO CAPS: 100.0000 mg | ORAL_CAPSULE | Freq: Three times a day (TID) | ORAL | 0 refills | Status: DC | PRN

## 2024-04-11 MED ORDER — PREDNISONE 10 MG (21) PO TBPK: ORAL_TABLET | ORAL | 0 refills | Status: DC

## 2024-04-11 MED ORDER — ALBUTEROL SULFATE HFA 108 (90 BASE) MCG/ACT IN AERS: 1.0000 | INHALATION_SPRAY | Freq: Four times a day (QID) | RESPIRATORY_TRACT | 0 refills | Status: AC | PRN

## 2024-04-11 NOTE — Progress Notes (Signed)
 We are sorry that you are not feeling well.  Here is how we plan to help!  Based on your presentation I believe you most likely have A cough due to bacteria.  When patients have a fever and a productive cough with a change in color or increased sputum production, we are concerned about bacterial bronchitis.  If left untreated it can progress to pneumonia.  If your symptoms do not improve with your treatment plan it is important that you contact your provider.   I have prescribed Azithromyin 250 mg: two tablets now and then one tablet daily for 4 additonal days    In addition you may use A non-prescription cough medication called Mucinex DM: take 2 tablets every 12 hours. and A prescription cough medication called Tessalon Perles 100mg . You may take 1-2 capsules every 8 hours as needed for your cough.  I have also prescribed Prednisone  10 mg daily for 6 days (see taper instructions below)  Directions for 6 day taper: Day 1: 2 tablets before breakfast, 1 after both lunch & dinner and 2 at bedtime Day 2: 1 tab before breakfast, 1 after both lunch & dinner and 2 at bedtime Day 3: 1 tab at each meal & 1 at bedtime Day 4: 1 tab at breakfast, 1 at lunch, 1 at bedtime Day 5: 1 tab at breakfast & 1 tab at bedtime Day 6: 1 tab at breakfast  I have prescribed Albuterol inhaler Use 1-2 puffs every 6 hours as needed for shortness of breath, chest tightness, and/or wheezing.  From your responses in the eVisit questionnaire you describe inflammation in the upper respiratory tract which is causing a significant cough.  This is commonly called Bronchitis and has four common causes:   Allergies Viral Infections Acid Reflux Bacterial Infection Allergies, viruses and acid reflux are treated by controlling symptoms or eliminating the cause. An example might be a cough caused by taking certain blood pressure medications. You stop the cough by changing the medication. Another example might be a cough caused by acid  reflux. Controlling the reflux helps control the cough.  USE OF BRONCHODILATOR (RESCUE) INHALERS: There is a risk from using your bronchodilator too frequently.  The risk is that over-reliance on a medication which only relaxes the muscles surrounding the breathing tubes can reduce the effectiveness of medications prescribed to reduce swelling and congestion of the tubes themselves.  Although you feel brief relief from the bronchodilator inhaler, your asthma may actually be worsening with the tubes becoming more swollen and filled with mucus.  This can delay other crucial treatments, such as oral steroid medications. If you need to use a bronchodilator inhaler daily, several times per day, you should discuss this with your provider.  There are probably better treatments that could be used to keep your asthma under control.     HOME CARE Only take medications as instructed by your medical team. Complete the entire course of an antibiotic. Drink plenty of fluids and get plenty of rest. Avoid close contacts especially the very young and the elderly Cover your mouth if you cough or cough into your sleeve. Always remember to wash your hands A steam or ultrasonic humidifier can help congestion.   GET HELP RIGHT AWAY IF: You develop worsening fever. You become short of breath You cough up blood. Your symptoms persist after you have completed your treatment plan MAKE SURE YOU  Understand these instructions. Will watch your condition. Will get help right away if you are not  doing well or get worse.  Your e-visit answers were reviewed by a board certified advanced clinical practitioner to complete your personal care plan.  Depending on the condition, your plan could have included both over the counter or prescription medications. If there is a problem please reply  once you have received a response from your provider. Your safety is important to us .  If you have drug allergies check your  prescription carefully.    You can use MyChart to ask questions about todays visit, request a non-urgent call back, or ask for a work or school excuse for 24 hours related to this e-Visit. If it has been greater than 24 hours you will need to follow up with your provider, or enter a new e-Visit to address those concerns. You will get an e-mail in the next two days asking about your experience.  I hope that your e-visit has been valuable and will speed your recovery. Thank you for using e-visits.   I have spent 5 minutes in review of e-visit questionnaire, review and updating patient chart, medical decision making and response to patient.   Delon CHRISTELLA Dickinson, PA-C

## 2024-04-13 ENCOUNTER — Encounter: Payer: Self-pay | Admitting: Physician Assistant

## 2024-04-13 ENCOUNTER — Ambulatory Visit: Payer: Self-pay | Admitting: Physician Assistant

## 2024-04-13 VITALS — BP 124/85 | HR 67 | Resp 16 | Ht 68.0 in | Wt 200.0 lb

## 2024-04-13 DIAGNOSIS — R051 Acute cough: Secondary | ICD-10-CM

## 2024-04-13 MED ORDER — ONDANSETRON 8 MG PO TBDP: 8.0000 mg | ORAL_TABLET | Freq: Three times a day (TID) | ORAL | 0 refills | Status: DC | PRN

## 2024-04-13 MED ORDER — HYDROCOD POLI-CHLORPHE POLI ER 10-8 MG/5ML PO SUER: 5.0000 mL | Freq: Every evening | ORAL | 0 refills | Status: DC | PRN

## 2024-04-13 NOTE — Progress Notes (Signed)
" ° °  Subjective: Cough    Patient ID: Rachael Hoover, female    DOB: Sep 01, 1985, 38 y.o.   MRN: [id number]  HPI Patient is follow-up for cough.  Patient was seen by telehealth 3 days ago and prescribed Z-Pak, Medrol Dosepak, Tessalon Perles, and an inhaler.  Patient believes she may have contacted flu on Christmas.  States she is continue to have coughing fits which are worse at night.  Denies fever chills associated complaint.  Review of Systems Allergic rhinitis, anxiety, and obesity.    Objective:   Physical Exam  04/13/2024   1:17 PM 1:21 PM  BP 154/87 124/85  Cuff Size Normal Large  Pulse Rate 74 67  Weight 200 lb (90.7 kg) --  Height 5' 8 (1.727 m) --  Resp 16 --  SpO2 100 % 100 %   BMI: 30.41 kg/m2  BSA: 2.09 m2  HEENT was unremarkable. Neck is supple for lymphadenopathy or bruits. Lungs are clear to auscultation. Heart is regular rate and rhythm.       Assessment & Plan: Respiratory infection  Continue previous medications advised to take Tussionex every 12 hours as needed for cough.  "

## 2024-04-13 NOTE — Progress Notes (Signed)
 Pt presents today for eval on lungs. Pt did have an evisit and was prescribed medication but she feels she needs her lungs listened to. She had a coughing fit Monday that made her throw up the cough medicine. Pt has had two full days of medicine so far. Possibly had flu on christmas.

## 2024-04-22 ENCOUNTER — Ambulatory Visit (INDEPENDENT_AMBULATORY_CARE_PROVIDER_SITE_OTHER): Admitting: Physician Assistant

## 2024-04-22 ENCOUNTER — Encounter: Payer: Self-pay | Admitting: Physician Assistant

## 2024-04-22 ENCOUNTER — Ambulatory Visit
Admission: RE | Admit: 2024-04-22 | Discharge: 2024-04-22 | Disposition: A | Attending: Physician Assistant | Admitting: Physician Assistant

## 2024-04-22 ENCOUNTER — Ambulatory Visit
Admission: RE | Admit: 2024-04-22 | Discharge: 2024-04-22 | Disposition: A | Source: Ambulatory Visit | Attending: Physician Assistant

## 2024-04-22 VITALS — BP 152/95 | HR 107 | Wt 200.6 lb

## 2024-04-22 DIAGNOSIS — R030 Elevated blood-pressure reading, without diagnosis of hypertension: Secondary | ICD-10-CM | POA: Diagnosis not present

## 2024-04-22 DIAGNOSIS — R0989 Other specified symptoms and signs involving the circulatory and respiratory systems: Secondary | ICD-10-CM | POA: Diagnosis present

## 2024-04-22 DIAGNOSIS — R059 Cough, unspecified: Secondary | ICD-10-CM | POA: Diagnosis not present

## 2024-04-22 MED ORDER — PREDNISONE 20 MG PO TABS: 20.0000 mg | ORAL_TABLET | Freq: Every day | ORAL | 0 refills | Status: AC

## 2024-04-22 NOTE — Progress Notes (Signed)
 " Established patient visit  Patient: Rachael Hoover   DOB: Sep 07, 1985   39 y.o. Female  MRN: [id number] Visit Date: 04/22/2024  Today's healthcare provider: Jolynn Spencer, PA-C   Chief Complaint  Patient presents with   Cough    Associated symptoms: congestion Cough has done zpack and steroid X 3 weeks   Neck Pain    Started 2 days ago   Subjective     HPI     Cough    Additional comments: Associated symptoms: congestion Cough has done zpack and steroid X 3 weeks        Neck Pain    Additional comments: Started 2 days ago      Last edited by Rosas, Joseline E, CMA on 04/22/2024  3:26 PM.       Discussed the use of AI scribe software for clinical note transcription with the patient, who gave verbal consent to proceed.  History of Present Illness Rachael Hoover is a 39 year old female who presents with persistent cough and congestion.  She has had symptoms for about three weeks, starting with a cough that has persisted and recently become productive with green mucus. Congestion developed over the last few days, is worse in the mornings, and improves with Claritin D. She completed a six-day steroid course about one week ago without improvement and has also taken an over-the-counter cold medication with acetaminophen .  She feels a crackling sensation in her lungs, especially when lying down, and feels mucus movement not tied to breathing. She denies shortness of breath and chest pain and does not describe wheezing. She usually has sinus pressure and congestion when ill, with past symptoms more in her sinuses than lungs.  She has had recurrent cough episodes since October, each lasting about three weeks. She links some illnesses to exposure from her toddler in daycare. Despite symptoms she continues to run three to five miles regularly.  She has no eye discharge or sore throat, though her throat feels slightly scratchy. She denies fever but felt warm last night.        04/22/2024    3:26 PM 08/21/2023    8:45 AM 11/20/2022    8:16 AM  Depression screen PHQ 2/9  Decreased Interest 2 0 0  Down, Depressed, Hopeless 2 0 1  PHQ - 2 Score 4 0 1  Altered sleeping 3 1 0  Tired, decreased energy 3 1 1   Change in appetite 1 1 3   Feeling bad or failure about yourself  0 0 0  Trouble concentrating 1 0 0  Moving slowly or fidgety/restless 0 0 0  Suicidal thoughts 0 0 0  PHQ-9 Score 12 3  5    Difficult doing work/chores Somewhat difficult Not difficult at all      Data saved with a previous flowsheet row definition      04/22/2024    3:26 PM 08/21/2023    8:46 AM 11/20/2022    8:16 AM 08/19/2022    9:00 AM  GAD 7 : Generalized Anxiety Score  Nervous, Anxious, on Edge 0 1 1 1   Control/stop worrying 0 0 0 1  Worry too much - different things 0 0 1 1  Trouble relaxing 0 0 0 0  Restless 0 0 0 0  Easily annoyed or irritable 0 0 1 1  Afraid - awful might happen 0 0 1 2  Total GAD 7 Score 0 1 4 6   Anxiety Difficulty Not difficult at all Not difficult at  all Somewhat difficult Not difficult at all    Medications: Show/hide medication list[1]  Review of Systems All negative Except see HPI       Objective    BP (!) 152/95 (BP Location: Right Arm, Patient Position: Sitting)   Pulse (!) 107   Wt 200 lb 9.6 oz (91 kg)   SpO2 99%   BMI 30.50 kg/m     Physical Exam Vitals reviewed.  Constitutional:      Appearance: She is normal weight.  HENT:     Head: Normocephalic and atraumatic.     Right Ear: Ear canal and external ear normal.     Left Ear: Ear canal and external ear normal.     Nose: Congestion (mild) and rhinorrhea present.     Mouth/Throat:     Pharynx: Posterior oropharyngeal erythema (mild) present.     Comments: Postnasal drainage noted Eyes:     General: No scleral icterus.       Right eye: No discharge.        Left eye: No discharge.     Extraocular Movements: Extraocular movements intact.     Pupils: Pupils are equal, round, and  reactive to light.  Cardiovascular:     Rate and Rhythm: Normal rate and regular rhythm.  Pulmonary:     Effort: Pulmonary effort is normal.     Breath sounds: Wheezing and rhonchi present.  Chest:     Chest wall: No tenderness.  Abdominal:     General: Abdomen is flat. Bowel sounds are normal.     Palpations: Abdomen is soft.  Lymphadenopathy:     Cervical: No cervical adenopathy.  Neurological:     Mental Status: She is alert.      No results found for any visits on 04/22/24.      Assessment & Plan Prolonged cough with abnormal lung sounds Most likely due to reactive airway disease. Prolonged cough for three weeks with abnormal lung sounds. Differential includes asthma exacerbation, bronchitis, or pneumonia. Recent steroid course with minimal improvement. No smoking, COPD, or asthma. The patient reported taking her temperature the night before, which was normal, but she felt a little hot. Concerns about recurrent respiratory issues possibly related to daycare exposure. Recent family history of kidney cancer but no symptoms of systemic disease. - Ordered chest x-ray to evaluate for bronchitis or pneumonia. - Advised against immediate antibiotic use due to potential allergies. - Recommended starting multivitamins to boost immunity. - Advised on wearing a mask to reduce exposure to infections. - Encouraged healthy eating and regular exercise. Will follow-up  General Health Maintenance Emphasized boosting immunity through multivitamins and healthy lifestyle choices to prevent recurrent infections. - Start taking multivitamins, such as Centrum, to enhance immunity. - Maintain a healthy diet and regular exercise regimen.   Abnormal lung sounds (Primary)  - DG Chest 2 View Showed no acute intrathoracic process.  Elevated blood pressure reading Most likely due to cough, congestion Will follow-up after recovery from respiratory symptoms   No orders of the defined types were  placed in this encounter.   No follow-ups on file.   The patient was advised to call back or seek an in-person evaluation if the symptoms worsen or if the condition fails to improve as anticipated.  I discussed the assessment and treatment plan with the patient. The patient was provided an opportunity to ask questions and all were answered. The patient agreed with the plan and demonstrated an understanding of the instructions.  I, Jaleeyah Munce  Davieon Stockham, PA-C have reviewed all documentation for this visit. The documentation on 04/22/2024  for the exam, diagnosis, procedures, and orders are all accurate and complete.  Jolynn Spencer, [facility], MMS [facility] [phone] (phone) [phone] (fax)  [facility]     [1]  Outpatient Medications Prior to Visit  Medication Sig   albuterol  (VENTOLIN  HFA) 108 (90 Base) MCG/ACT inhaler Inhale 1-2 puffs into the lungs every 6 (six) hours as needed.   benzonatate  (TESSALON ) 100 MG capsule Take 1-2 capsules (100-200 mg total) by mouth 3 (three) times daily as needed.   norethindrone (MICRONOR) 0.35 MG tablet Take 1 tablet by mouth daily.   ondansetron  (ZOFRAN -ODT) 8 MG disintegrating tablet Take 1 tablet (8 mg total) by mouth every 8 (eight) hours as needed for nausea or vomiting.   sertraline  (ZOLOFT ) 50 MG tablet TAKE 1 TABLET (50 MG TOTAL) BY MOUTH DAILY. TAKE 50 MG BY MOUTH DAILY.   chlorpheniramine-HYDROcodone (TUSSIONEX) 10-8 MG/5ML Take 5 mLs by mouth at bedtime as needed for cough.   predniSONE  (STERAPRED UNI-PAK 21 TAB) 10 MG (21) TBPK tablet 6 day taper; take as directed on package instructions   No facility-administered medications prior to visit.   "

## 2024-04-25 ENCOUNTER — Encounter: Payer: Self-pay | Admitting: Physician Assistant

## 2024-04-25 ENCOUNTER — Ambulatory Visit: Payer: Self-pay | Admitting: Physician Assistant

## 2024-04-26 ENCOUNTER — Ambulatory Visit: Admitting: Physician Assistant

## 2024-04-26 ENCOUNTER — Encounter: Payer: Self-pay | Admitting: Physician Assistant

## 2024-04-26 VITALS — BP 136/86 | HR 105 | Temp 98.4°F | Resp 16 | Ht 68.0 in | Wt 201.5 lb

## 2024-04-26 DIAGNOSIS — H119 Unspecified disorder of conjunctiva: Secondary | ICD-10-CM

## 2024-04-26 DIAGNOSIS — R058 Other specified cough: Secondary | ICD-10-CM | POA: Diagnosis not present

## 2024-04-26 DIAGNOSIS — J329 Chronic sinusitis, unspecified: Secondary | ICD-10-CM

## 2024-04-26 MED ORDER — IPRATROPIUM BROMIDE 0.03 % NA SOLN: 2.0000 | Freq: Two times a day (BID) | NASAL | 0 refills | Status: AC

## 2024-04-26 MED ORDER — PROMETHAZINE-DM 6.25-15 MG/5ML PO SYRP: 5.0000 mL | ORAL_SOLUTION | Freq: Four times a day (QID) | ORAL | 0 refills | Status: AC | PRN

## 2024-04-26 NOTE — Progress Notes (Unsigned)
 " Established patient visit  Patient: Rachael Hoover   DOB: 1986-04-05   39 y.o. Female  MRN: [id number] Visit Date: 04/26/2024  Today's healthcare provider: Jolynn Spencer, PA-C   Chief Complaint  Patient presents with   Acute Visit    Sinus congestion x 3 weeks. Not improving from last OV 04/23/23   Subjective     HPI     Acute Visit    Additional comments: Sinus congestion x 3 weeks. Not improving from last OV 04/23/23      Last edited by Wilfred Hargis RAMAN, CMA on 04/26/2024  3:26 PM.       Discussed the use of AI scribe software for clinical note transcription with the patient, who gave verbal consent to proceed.  History of Present Illness        04/22/2024    3:26 PM 08/21/2023    8:45 AM 11/20/2022    8:16 AM  Depression screen PHQ 2/9  Decreased Interest 2 0 0  Down, Depressed, Hopeless 2 0 1  PHQ - 2 Score 4 0 1  Altered sleeping 3 1 0  Tired, decreased energy 3 1 1   Change in appetite 1 1 3   Feeling bad or failure about yourself  0 0 0  Trouble concentrating 1 0 0  Moving slowly or fidgety/restless 0 0 0  Suicidal thoughts 0 0 0  PHQ-9 Score 12 3  5    Difficult doing work/chores Somewhat difficult Not difficult at all      Data saved with a previous flowsheet row definition      04/22/2024    3:26 PM 08/21/2023    8:46 AM 11/20/2022    8:16 AM 08/19/2022    9:00 AM  GAD 7 : Generalized Anxiety Score  Nervous, Anxious, on Edge 0 1 1 1   Control/stop worrying 0 0 0 1  Worry too much - different things 0 0 1 1  Trouble relaxing 0 0 0 0  Restless 0 0 0 0  Easily annoyed or irritable 0 0 1 1  Afraid - awful might happen 0 0 1 2  Total GAD 7 Score 0 1 4 6   Anxiety Difficulty Not difficult at all Not difficult at all Somewhat difficult Not difficult at all    Medications: Show/hide medication list[1]  Review of Systems All negative Except see HPI   {Insert previous labs (optional):23779} {See past labs  Heme  Chem  Endocrine  Serology  Results Review  (optional):1}   Objective    BP 136/86   Pulse (!) 105   Temp 98.4 F (36.9 C) (Oral)   Resp 16   Ht 5' 8 (1.727 m)   Wt 201 lb 8 oz (91.4 kg)   SpO2 99%   BMI 30.64 kg/m  {Insert last BP/Wt (optional):23777}{See vitals history (optional):1}   Physical Exam   No results found for any visits on 04/26/24.      Assessment and Plan Assessment & Plan     No orders of the defined types were placed in this encounter.   No follow-ups on file.   The patient was advised to call back or seek an in-person evaluation if the symptoms worsen or if the condition fails to improve as anticipated.  I discussed the assessment and treatment plan with the patient. The patient was provided an opportunity to ask questions and all were answered. The patient agreed with the plan and demonstrated an understanding of the instructions.  I, Marilynn Ekstein, PA-C  have reviewed all documentation for this visit. The documentation on 04/26/2024  for the exam, diagnosis, procedures, and orders are all accurate and complete.  Jolynn Spencer, [facility], MMS [facility] [phone] (phone) [phone] (fax)  [facility]      [1] Outpatient Medications Prior to Visit  Medication Sig   albuterol  (VENTOLIN  HFA) 108 (90 Base) MCG/ACT inhaler Inhale 1-2 puffs into the lungs every 6 (six) hours as needed.   norethindrone (MICRONOR) 0.35 MG tablet Take 1 tablet by mouth daily.   predniSONE  (DELTASONE ) 20 MG tablet Take 1 tablet (20 mg total) by mouth daily with breakfast. Please, take 40 mg for 5 days and then 20 mg for 5 days.   sertraline  (ZOLOFT ) 50 MG tablet TAKE 1 TABLET (50 MG TOTAL) BY MOUTH DAILY. TAKE 50 MG BY MOUTH DAILY.   [DISCONTINUED] ondansetron  (ZOFRAN -ODT) 8 MG disintegrating tablet Take 1 tablet (8 mg total) by mouth every 8 (eight) hours as needed for nausea or vomiting.   [DISCONTINUED] benzonatate  (TESSALON ) 100 MG capsule Take 1-2 capsules (100-200 mg  total) by mouth 3 (three) times daily as needed.   No facility-administered medications prior to visit.  "

## 2024-05-08 ENCOUNTER — Other Ambulatory Visit: Payer: Self-pay | Admitting: Physician Assistant

## 2024-08-25 ENCOUNTER — Encounter: Admitting: Physician Assistant
